# Patient Record
Sex: Female | Born: 1965 | Race: White | Hispanic: No | Marital: Married | Smoking: Current every day smoker
Health system: Southern US, Community
[De-identification: ages and names within clinical notes are randomized; demographics above are authoritative.]

## PROBLEM LIST (undated history)

## (undated) DIAGNOSIS — I10 Essential (primary) hypertension: Secondary | ICD-10-CM

## (undated) DIAGNOSIS — F32A Depression, unspecified: Secondary | ICD-10-CM

## (undated) DIAGNOSIS — M51369 Other intervertebral disc degeneration, lumbar region without mention of lumbar back pain or lower extremity pain: Secondary | ICD-10-CM

## (undated) DIAGNOSIS — F329 Major depressive disorder, single episode, unspecified: Secondary | ICD-10-CM

## (undated) DIAGNOSIS — M1990 Unspecified osteoarthritis, unspecified site: Secondary | ICD-10-CM

## (undated) HISTORY — DX: Depression, unspecified: F32.A

## (undated) HISTORY — DX: Major depressive disorder, single episode, unspecified: F32.9

---

## 2001-02-22 ENCOUNTER — Ambulatory Visit (HOSPITAL_COMMUNITY): Admission: RE | Admit: 2001-02-22 | Discharge: 2001-02-22 | Payer: Self-pay | Admitting: Pulmonary Disease

## 2002-06-18 ENCOUNTER — Emergency Department (HOSPITAL_COMMUNITY): Admission: EM | Admit: 2002-06-18 | Discharge: 2002-06-18 | Payer: Self-pay | Admitting: *Deleted

## 2004-09-18 ENCOUNTER — Ambulatory Visit (HOSPITAL_COMMUNITY): Admission: RE | Admit: 2004-09-18 | Discharge: 2004-09-18 | Payer: Self-pay | Admitting: Family Medicine

## 2004-12-05 ENCOUNTER — Ambulatory Visit (HOSPITAL_COMMUNITY): Admission: RE | Admit: 2004-12-05 | Discharge: 2004-12-05 | Payer: Self-pay | Admitting: Family Medicine

## 2005-04-07 ENCOUNTER — Ambulatory Visit (HOSPITAL_COMMUNITY): Admission: RE | Admit: 2005-04-07 | Discharge: 2005-04-07 | Payer: Self-pay | Admitting: Family Medicine

## 2005-04-14 ENCOUNTER — Ambulatory Visit (HOSPITAL_COMMUNITY): Admission: RE | Admit: 2005-04-14 | Discharge: 2005-04-14 | Payer: Self-pay | Admitting: Family Medicine

## 2005-11-01 ENCOUNTER — Emergency Department (HOSPITAL_COMMUNITY): Admission: EM | Admit: 2005-11-01 | Discharge: 2005-11-01 | Payer: Self-pay | Admitting: Emergency Medicine

## 2006-12-19 ENCOUNTER — Emergency Department: Payer: Self-pay | Admitting: Internal Medicine

## 2007-10-25 ENCOUNTER — Emergency Department (HOSPITAL_COMMUNITY): Admission: EM | Admit: 2007-10-25 | Discharge: 2007-10-26 | Payer: Self-pay | Admitting: Emergency Medicine

## 2007-10-29 ENCOUNTER — Emergency Department (HOSPITAL_COMMUNITY): Admission: EM | Admit: 2007-10-29 | Discharge: 2007-10-29 | Payer: Self-pay | Admitting: Emergency Medicine

## 2007-11-01 ENCOUNTER — Emergency Department (HOSPITAL_COMMUNITY): Admission: EM | Admit: 2007-11-01 | Discharge: 2007-11-01 | Payer: Self-pay | Admitting: Emergency Medicine

## 2008-07-21 ENCOUNTER — Emergency Department (HOSPITAL_COMMUNITY): Admission: EM | Admit: 2008-07-21 | Discharge: 2008-07-21 | Payer: Self-pay | Admitting: Emergency Medicine

## 2010-04-28 LAB — URINALYSIS, ROUTINE W REFLEX MICROSCOPIC
Bilirubin Urine: NEGATIVE
Glucose, UA: NEGATIVE mg/dL
Ketones, ur: NEGATIVE mg/dL
Leukocytes, UA: NEGATIVE
Nitrite: NEGATIVE
Protein, ur: NEGATIVE mg/dL
Specific Gravity, Urine: 1.015 (ref 1.005–1.030)
Urobilinogen, UA: 0.2 mg/dL (ref 0.0–1.0)
pH: 6.5 (ref 5.0–8.0)

## 2010-04-28 LAB — BASIC METABOLIC PANEL
BUN: 4 mg/dL — ABNORMAL LOW (ref 6–23)
CO2: 28 mEq/L (ref 19–32)
Calcium: 9.7 mg/dL (ref 8.4–10.5)
Chloride: 106 mEq/L (ref 96–112)
Creatinine, Ser: 0.65 mg/dL (ref 0.4–1.2)
GFR calc Af Amer: >60 mL/min (ref >60)
GFR calc non Af Amer: >60 mL/min (ref >60)
Glucose, Bld: 110 mg/dL — ABNORMAL HIGH (ref 70–99)
Potassium: 4.2 mEq/L (ref 3.5–5.1)
Sodium: 138 mEq/L (ref 135–145)

## 2010-04-28 LAB — CBC
HCT: 39.5 % (ref 36.0–46.0)
Hemoglobin: 13.9 g/dL (ref 12.0–15.0)
MCHC: 35.2 g/dL (ref 30.0–36.0)
MCV: 92.2 fL (ref 78.0–100.0)
Platelets: 214 10*3/uL (ref 150–400)
RBC: 4.29 MIL/uL (ref 3.87–5.11)
RDW: 13.8 % (ref 11.5–15.5)
WBC: 6.3 10*3/uL (ref 4.0–10.5)

## 2010-04-28 LAB — URINE MICROSCOPIC-ADD ON

## 2010-04-28 LAB — DIFFERENTIAL
Basophils Absolute: 0 10*3/uL (ref 0.0–0.1)
Basophils Relative: 0 % (ref 0–1)
Eosinophils Absolute: 0.1 10*3/uL (ref 0.0–0.7)
Eosinophils Relative: 1 % (ref 0–5)
Lymphocytes Relative: 17 % (ref 12–46)
Lymphs Abs: 1 10*3/uL (ref 0.7–4.0)
Monocytes Absolute: 0.3 10*3/uL (ref 0.1–1.0)
Monocytes Relative: 4 % (ref 3–12)
Neutro Abs: 4.9 10*3/uL (ref 1.7–7.7)
Neutrophils Relative %: 78 % — ABNORMAL HIGH (ref 43–77)

## 2010-04-28 LAB — PREGNANCY, URINE: Preg Test, Ur: NEGATIVE

## 2010-06-06 NOTE — Procedures (Signed)
[facility]  Patient:    LAN, ENTSMINGER Visit Number: [id number] MRN: [id number]          Service Type: Attending:  Kari Baars, M.D. Dictated by:   Kari Baars, M.D. Proc. Date: 02/22/01                      Pulmonary Function Test Inter.  IMPRESSION: 1. Spirometry shows a mild ventilatory defect without definite air flow    obstruction. 2. Lung volumes show restrictive change 3. DLCO is moderately reduced at about the same amount as the restrictive    change. 4. ABGs show mild resting hypoxemia compared to predicted, but are still    normal. 5. There is no significant bronchodilator change. Dictated by:   Kari Baars, M.D. Attending:  Kari Baars, M.D. DD:  02/22/01 TD:  02/23/01 Job: [id number]

## 2010-11-07 ENCOUNTER — Encounter: Payer: Self-pay | Admitting: *Deleted

## 2010-11-07 ENCOUNTER — Emergency Department (HOSPITAL_COMMUNITY)
Admission: EM | Admit: 2010-11-07 | Discharge: 2010-11-08 | Disposition: A | Discharge status: Home / Self Care | Payer: Self-pay | Attending: Emergency Medicine | Admitting: Emergency Medicine

## 2010-11-07 ENCOUNTER — Emergency Department (HOSPITAL_COMMUNITY): Payer: Self-pay

## 2010-11-07 DIAGNOSIS — R6883 Chills (without fever): Secondary | ICD-10-CM | POA: Insufficient documentation

## 2010-11-07 DIAGNOSIS — R059 Cough, unspecified: Secondary | ICD-10-CM | POA: Insufficient documentation

## 2010-11-07 DIAGNOSIS — R1032 Left lower quadrant pain: Secondary | ICD-10-CM | POA: Insufficient documentation

## 2010-11-07 DIAGNOSIS — F17200 Nicotine dependence, unspecified, uncomplicated: Secondary | ICD-10-CM | POA: Insufficient documentation

## 2010-11-07 DIAGNOSIS — D72829 Elevated white blood cell count, unspecified: Secondary | ICD-10-CM | POA: Insufficient documentation

## 2010-11-07 DIAGNOSIS — R112 Nausea with vomiting, unspecified: Secondary | ICD-10-CM | POA: Insufficient documentation

## 2010-11-07 DIAGNOSIS — R05 Cough: Secondary | ICD-10-CM

## 2010-11-07 LAB — BASIC METABOLIC PANEL
BUN: 8 mg/dL (ref 6–23)
CO2: 27 mEq/L (ref 19–32)
Chloride: 101 mEq/L (ref 96–112)
Creatinine, Ser: 0.63 mg/dL (ref 0.50–1.10)
GFR calc Af Amer: >90 mL/min (ref >90)
GFR calc non Af Amer: >90 mL/min (ref >90)
Glucose, Bld: 107 mg/dL — ABNORMAL HIGH (ref 70–99)
Potassium: 3.9 mEq/L (ref 3.5–5.1)

## 2010-11-07 LAB — DIFFERENTIAL
Basophils Absolute: 0 10*3/uL (ref 0.0–0.1)
Basophils Relative: 0 % (ref 0–1)
Eosinophils Absolute: 0 10*3/uL (ref 0.0–0.7)
Eosinophils Relative: 0 % (ref 0–5)
Lymphocytes Relative: 8 % — ABNORMAL LOW (ref 12–46)
Lymphs Abs: 1 10*3/uL (ref 0.7–4.0)
Monocytes Absolute: 0.7 10*3/uL (ref 0.1–1.0)
Monocytes Relative: 5 % (ref 3–12)
Neutro Abs: 11 10*3/uL — ABNORMAL HIGH (ref 1.7–7.7)
Neutrophils Relative %: 87 % — ABNORMAL HIGH (ref 43–77)

## 2010-11-07 LAB — CBC
HCT: 42.9 % (ref 36.0–46.0)
Hemoglobin: 14.6 g/dL (ref 12.0–15.0)
MCH: 31.3 pg (ref 26.0–34.0)
MCHC: 34 g/dL (ref 30.0–36.0)
MCV: 91.9 fL (ref 78.0–100.0)
Platelets: 270 10*3/uL (ref 150–400)
RBC: 4.67 MIL/uL (ref 3.87–5.11)
RDW: 13.3 % (ref 11.5–15.5)

## 2010-11-07 LAB — URINALYSIS, ROUTINE W REFLEX MICROSCOPIC
Glucose, UA: NEGATIVE mg/dL
Hgb urine dipstick: NEGATIVE
Ketones, ur: NEGATIVE mg/dL
Leukocytes, UA: NEGATIVE
Nitrite: NEGATIVE
Protein, ur: NEGATIVE mg/dL
Specific Gravity, Urine: >1.03 — ABNORMAL HIGH (ref 1.005–1.030)
Urobilinogen, UA: 0.2 mg/dL (ref 0.0–1.0)
pH: 5.5 (ref 5.0–8.0)

## 2010-11-07 MED ORDER — ALBUTEROL SULFATE (5 MG/ML) 0.5% IN NEBU
2.5000 mg | INHALATION_SOLUTION | Freq: Once | RESPIRATORY_TRACT | Status: AC
  Administered 2010-11-07: 2.5 mg via RESPIRATORY_TRACT
  Filled 2010-11-07: qty 0.5

## 2010-11-07 MED ORDER — ALBUTEROL SULFATE (5 MG/ML) 0.5% IN NEBU
INHALATION_SOLUTION | RESPIRATORY_TRACT | Status: AC
  Administered 2010-11-07: 2.5 mg
  Filled 2010-11-07: qty 0.5

## 2010-11-07 MED ORDER — ACETAMINOPHEN 500 MG PO TABS
1000.0000 mg | ORAL_TABLET | Freq: Once | ORAL | Status: AC
  Administered 2010-11-07: 1000 mg via ORAL
  Filled 2010-11-07: qty 2

## 2010-11-07 MED ORDER — SODIUM CHLORIDE 0.9 % IV BOLUS (SEPSIS)
1000.0000 mL | Freq: Once | INTRAVENOUS | Status: AC
  Administered 2010-11-07: 1000 mL via INTRAVENOUS

## 2010-11-07 MED ORDER — SODIUM CHLORIDE 0.9 % IV BOLUS (SEPSIS): 1000.0000 mL | Freq: Once | INTRAVENOUS | Status: DC

## 2010-11-07 MED ORDER — ONDANSETRON HCL 4 MG/2ML IJ SOLN
4.0000 mg | Freq: Once | INTRAMUSCULAR | Status: AC
  Administered 2010-11-07: 4 mg via INTRAVENOUS
  Filled 2010-11-07: qty 2

## 2010-11-07 NOTE — ED Notes (Signed)
Pt c/o n/v, chills, lower left sided abdominal pain.

## 2010-11-08 MED ORDER — GUAIFENESIN-CODEINE 100-10 MG/5ML PO SOLN
ORAL | Status: AC
  Filled 2010-11-08: qty 5

## 2010-11-08 MED ORDER — GUAIFENESIN-CODEINE 100-10 MG/5ML PO SYRP: 5.0000 mL | ORAL_SOLUTION | Freq: Three times a day (TID) | ORAL | Status: AC | PRN

## 2010-11-08 MED ORDER — GUAIFENESIN-CODEINE 100-10 MG/5ML PO SOLN
10.0000 mL | Freq: Once | ORAL | Status: AC
  Administered 2010-11-08: 10 mL via ORAL

## 2010-11-08 MED ORDER — PROMETHAZINE HCL 25 MG PO TABS: 12.5000 mg | ORAL_TABLET | Freq: Four times a day (QID) | ORAL | Status: AC | PRN

## 2010-11-08 NOTE — ED Provider Notes (Signed)
History     CSN: [id number] Arrival date & time: 11/07/2010  9:35 PM   First MD Initiated Contact with Patient 11/07/10 2313      Chief Complaint  Patient presents with  . Nausea  . Emesis  . Abdominal Pain  . Chills    (Consider location/radiation/quality/duration/timing/severity/associated sxs/prior treatment) HPI Comments: Seen 2313. Patient with cough, nausea, vomiting and abdominal pain. Abdominal pain is in LLQ and made worse by the cough. Denies diarrhea, blood in the stool. Cough is non productive. She has not taken anything to help the nausea, vomiting or cough.  Patient is a 45 y.o. female presenting with vomiting and abdominal pain. The history is provided by the patient.  Emesis  This is a new problem. The current episode started yesterday. The problem occurs 2 to 4 times per day. The problem has been gradually worsening. There has been no fever. Associated symptoms include abdominal pain, chills and cough.  Abdominal Pain The primary symptoms of the illness include abdominal pain, nausea and vomiting.  Additional symptoms associated with the illness include chills.    History reviewed. No pertinent past medical history.  History reviewed. No pertinent past surgical history.  History reviewed. No pertinent family history.  History  Substance Use Topics  . Smoking status: Current Everyday Smoker  . Smokeless tobacco: Not on file  . Alcohol Use: No    OB History    Grav Para Term Preterm Abortions TAB SAB Ect Mult Living                  Review of Systems  Constitutional: Positive for chills.  Respiratory: Positive for cough.   Gastrointestinal: Positive for nausea, vomiting and abdominal pain.  All other systems reviewed and are negative.    Allergies  Augmentin  Home Medications  No current outpatient prescriptions on file.  BP 125/83  Pulse 110  Temp(Src) 98.7 F (37.1 C) (Oral)  Resp 20  Ht 5\' 3"  (1.6 m)  Wt 154 lb (69.854 kg)  BMI  27.28 kg/m2  SpO2 98%  LMP 10/26/2010  Physical Exam  Nursing note and vitals reviewed. Constitutional: She is oriented to person, place, and time. She appears well-developed and well-nourished. No distress.  HENT:  Head: Normocephalic.  Eyes: EOM are normal.  Neck: Normal range of motion.  Cardiovascular: Normal rate, normal heart sounds and intact distal pulses.   Pulmonary/Chest: Breath sounds normal. She has no wheezes. She has no rales. She exhibits no tenderness.  Abdominal: Soft. She exhibits no distension. There is no tenderness. There is no rebound and no guarding.  Musculoskeletal: Normal range of motion.  Neurological: She is alert and oriented to person, place, and time.  Skin: Skin is warm and dry.    ED Course  Procedures (including critical care time)  Labs Reviewed  CBC - Abnormal; Notable for the following:    WBC 12.8 (*)    All other components within normal limits  DIFFERENTIAL - Abnormal; Notable for the following:    Neutrophils Relative 87 (*)    Neutro Abs 11.0 (*)    Lymphocytes Relative 8 (*)    All other components within normal limits  BASIC METABOLIC PANEL - Abnormal; Notable for the following:    Glucose, Bld 107 (*)    All other components within normal limits  URINALYSIS, ROUTINE W REFLEX MICROSCOPIC - Abnormal; Notable for the following:    Specific Gravity, Urine >1.030 (*)    Bilirubin Urine SMALL (*)  All other components within normal limits   Dg Chest 2 View  11/07/2010  *RADIOLOGY REPORT*  Clinical Data: Cough.  Congestion.  Tobacco use.  CHEST - 2 VIEW  Comparison: 04/07/2005  Findings: Faint prominence of the interstitium is present, and is often encountered in smokers.  No airspace opacity is observed.  Cardiac and mediastinal contours appear unremarkable.  No pleural effusion noted.  IMPRESSION:  1. Faint prominence of the interstitium is present, and is often encountered in smokers.    Otherwise, no significant abnormality  identified.  Original Report Authenticated By: Dellia Cloud, M.D.     No diagnosis found.    MDM  Patient with nausea, vomiting, cough and abdominal pain. Abdominal pain to LLQ worse with coughing. PE unremarkable. Unable to elicit pain with exam. Labs with slightly elevated wbc, no source found. Chest xray normal, UA c/w h/o vomiting.Patient given IVF, cough medicine, tylenol. Took PO fluids. Pt feels improved after observation and/or treatment in ED.Pt stable in ED with no significant deterioration in condition. MDM Reviewed: nursing note and vitals Interpretation: labs and x-ray           Nicoletta Dress. Colon Branch, MD 11/15/10 [phone]

## 2015-08-09 ENCOUNTER — Other Ambulatory Visit (HOSPITAL_COMMUNITY): Payer: Self-pay | Admitting: Family Medicine

## 2015-08-09 DIAGNOSIS — Z1231 Encounter for screening mammogram for malignant neoplasm of breast: Secondary | ICD-10-CM

## 2015-09-04 ENCOUNTER — Encounter (HOSPITAL_COMMUNITY): Payer: Self-pay | Admitting: Radiology

## 2015-09-04 ENCOUNTER — Ambulatory Visit (HOSPITAL_COMMUNITY)
Admission: RE | Admit: 2015-09-04 | Discharge: 2015-09-04 | Disposition: A | Discharge status: Home / Self Care | Payer: BLUE CROSS/BLUE SHIELD | Source: Ambulatory Visit | Attending: Family Medicine | Admitting: Family Medicine

## 2015-09-04 DIAGNOSIS — Z1231 Encounter for screening mammogram for malignant neoplasm of breast: Secondary | ICD-10-CM | POA: Diagnosis present

## 2017-04-05 ENCOUNTER — Other Ambulatory Visit (HOSPITAL_COMMUNITY): Payer: Self-pay | Admitting: Family Medicine

## 2017-04-05 DIAGNOSIS — E041 Nontoxic single thyroid nodule: Secondary | ICD-10-CM

## 2017-04-05 DIAGNOSIS — M25572 Pain in left ankle and joints of left foot: Secondary | ICD-10-CM

## 2017-04-05 DIAGNOSIS — M25571 Pain in right ankle and joints of right foot: Secondary | ICD-10-CM

## 2017-04-12 ENCOUNTER — Ambulatory Visit (HOSPITAL_COMMUNITY)
Admission: RE | Admit: 2017-04-12 | Discharge: 2017-04-12 | Disposition: A | Discharge status: Home / Self Care | Payer: BLUE CROSS/BLUE SHIELD | Source: Ambulatory Visit | Attending: Family Medicine | Admitting: Family Medicine

## 2017-04-12 ENCOUNTER — Encounter (HOSPITAL_COMMUNITY): Payer: Self-pay

## 2017-04-14 ENCOUNTER — Other Ambulatory Visit (HOSPITAL_COMMUNITY): Payer: Self-pay | Admitting: Family Medicine

## 2017-04-14 ENCOUNTER — Ambulatory Visit (HOSPITAL_COMMUNITY)
Admission: RE | Admit: 2017-04-14 | Discharge: 2017-04-14 | Disposition: A | Discharge status: Home / Self Care | Payer: BLUE CROSS/BLUE SHIELD | Source: Ambulatory Visit | Attending: Family Medicine | Admitting: Family Medicine

## 2017-04-14 DIAGNOSIS — M25571 Pain in right ankle and joints of right foot: Secondary | ICD-10-CM

## 2017-04-14 DIAGNOSIS — M25572 Pain in left ankle and joints of left foot: Secondary | ICD-10-CM | POA: Insufficient documentation

## 2017-04-14 DIAGNOSIS — R531 Weakness: Secondary | ICD-10-CM

## 2017-04-14 DIAGNOSIS — M7732 Calcaneal spur, left foot: Secondary | ICD-10-CM | POA: Insufficient documentation

## 2017-04-14 DIAGNOSIS — M7731 Calcaneal spur, right foot: Secondary | ICD-10-CM | POA: Diagnosis not present

## 2017-04-14 DIAGNOSIS — R52 Pain, unspecified: Secondary | ICD-10-CM

## 2017-04-14 DIAGNOSIS — E041 Nontoxic single thyroid nodule: Secondary | ICD-10-CM

## 2017-04-16 ENCOUNTER — Other Ambulatory Visit (HOSPITAL_COMMUNITY): Payer: Self-pay | Admitting: Family Medicine

## 2017-04-16 ENCOUNTER — Other Ambulatory Visit (HOSPITAL_COMMUNITY): Payer: Self-pay | Admitting: Pulmonary Disease

## 2017-04-16 DIAGNOSIS — E041 Nontoxic single thyroid nodule: Secondary | ICD-10-CM

## 2017-04-16 DIAGNOSIS — R05 Cough: Secondary | ICD-10-CM

## 2017-04-16 DIAGNOSIS — R059 Cough, unspecified: Secondary | ICD-10-CM

## 2017-04-21 ENCOUNTER — Encounter (HOSPITAL_COMMUNITY): Payer: Self-pay

## 2017-04-21 ENCOUNTER — Ambulatory Visit (HOSPITAL_COMMUNITY)
Admission: RE | Admit: 2017-04-21 | Discharge: 2017-04-21 | Disposition: A | Discharge status: Home / Self Care | Payer: BLUE CROSS/BLUE SHIELD | Source: Ambulatory Visit | Attending: Family Medicine | Admitting: Family Medicine

## 2017-04-21 DIAGNOSIS — E041 Nontoxic single thyroid nodule: Secondary | ICD-10-CM

## 2017-04-21 HISTORY — DX: Unspecified osteoarthritis, unspecified site: M19.90

## 2017-04-21 HISTORY — DX: Essential (primary) hypertension: I10

## 2017-04-21 MED ORDER — LIDOCAINE HCL (PF) 1 % IJ SOLN
INTRAMUSCULAR | Status: AC
  Filled 2017-04-21: qty 5

## 2017-04-22 ENCOUNTER — Ambulatory Visit (HOSPITAL_COMMUNITY): Payer: BLUE CROSS/BLUE SHIELD

## 2017-05-06 ENCOUNTER — Encounter (HOSPITAL_COMMUNITY): Payer: Self-pay | Admitting: Radiology

## 2017-06-21 ENCOUNTER — Encounter: Payer: Self-pay | Admitting: "Endocrinology

## 2017-06-21 ENCOUNTER — Ambulatory Visit (INDEPENDENT_AMBULATORY_CARE_PROVIDER_SITE_OTHER): Payer: BLUE CROSS/BLUE SHIELD | Admitting: "Endocrinology

## 2017-06-21 VITALS — BP 109/72 | HR 88 | Ht 63.0 in | Wt 167.0 lb

## 2017-06-21 DIAGNOSIS — E042 Nontoxic multinodular goiter: Secondary | ICD-10-CM | POA: Diagnosis not present

## 2017-06-21 NOTE — Progress Notes (Signed)
Endocrinology Consult Note                                            06/21/2017, 4:32 PM   Subjective:    Patient ID: Sarah Henderson, female    DOB: 05-18-1965, PCP Gareth Morgan, MD   Past Medical History:  Diagnosis Date  . Arthritis   . Depression   . Hypertension    History reviewed. No pertinent surgical history. Social History   Socioeconomic History  . Marital status: Married    Spouse name: Not on file  . Number of children: Not on file  . Years of education: Not on file  . Highest education level: Not on file  Occupational History  . Not on file  Social Needs  . Financial resource strain: Not on file  . Food insecurity:    Worry: Not on file    Inability: Not on file  . Transportation needs:    Medical: Not on file    Non-medical: Not on file  Tobacco Use  . Smoking status: Current Every Day Smoker  . Smokeless tobacco: Never Used  Substance and Sexual Activity  . Alcohol use: No  . Drug use: No  . Sexual activity: Not on file  Lifestyle  . Physical activity:    Days per week: Not on file    Minutes per session: Not on file  . Stress: Not on file  Relationships  . Social connections:    Talks on phone: Not on file    Gets together: Not on file    Attends religious service: Not on file    Active member of club or organization: Not on file    Attends meetings of clubs or organizations: Not on file    Relationship status: Not on file  Other Topics Concern  . Not on file  Social History Narrative  . Not on file   Outpatient Encounter Medications as of 06/21/2017  Medication Sig  . FLUoxetine (PROZAC) 10 MG tablet daily.  . hydrochlorothiazide (HYDRODIURIL) 25 MG tablet daily.  . naproxen (NAPROSYN) 500 MG tablet 2 (two) times daily.   No facility-administered encounter medications on file as of 06/21/2017.    ALLERGIES: Allergies  Allergen Reactions  . Amoxicillin-Pot Clavulanate     VACCINATION STATUS:  There is no immunization  history on file for this patient.  HPI Sarah Henderson is 52 y.o. female who presents today with a medical history as above. she is being seen in consultation for multinodular goiter requested by Gareth Morgan, MD.  -Her medical history starts in March 2019 when she was found to have visible thyroid nodule on physical exam.  She was sent for thyroid ultrasound which was performed on April 14, 2017 which revealed right lobe measuring 4.9 cm, left lobe measuring 5.2 cm, 1.7 cm suspicious nodule in the isthmus and 1.4 cm nodule in the upper pole of the left lobe. -She was sent for fine-needle aspiration which was performed on April 21, 2017 which showed a CT of undetermined significance from the 1.7 cm isthmus nodule.  That sample was sent for molecular testing which showed benign findings.  -Patient denies any family history of thyroid cancer, however multiple family members have various forms of malignancies including pancreatic cancer, breast cancer.  She is a chronic heavy smoker.  She did not  require any intervention for thyroid dysfunction.  She does not have recent thyroid function test.  -She denies exposure to neck radiation.  He denies dysphagia, odynophagia, voice change.  She denies heat/cold intolerance.  She denies tremors, palpitations, nor recent weight changes.  Review of Systems  Constitutional: no weight gain/loss, no fatigue, no subjective hyperthermia, no subjective hypothermia Eyes: no blurry vision, no xerophthalmia ENT: no sore throat, no nodules palpated in throat, no dysphagia/odynophagia, no hoarseness Cardiovascular: no Chest Pain, no Shortness of Breath, no palpitations, no leg swelling Respiratory: + cough, no SOB Gastrointestinal: no Nausea/Vomiting/Diarhhea Musculoskeletal: no muscle/joint aches Skin: no rashes Neurological: no tremors, no numbness, no tingling, no dizziness Psychiatric: no depression, no anxiety  Objective:    BP 109/72   Pulse 88   Ht 5\' 3"   (1.6 m)   Wt 167 lb (75.8 kg)   BMI 29.58 kg/m   Wt Readings from Last 3 Encounters:  06/21/17 167 lb (75.8 kg)  11/07/10 154 lb (69.9 kg)    Physical Exam  Constitutional: + overweight for height, not in acute distress, normal state of mind Eyes: PERRLA, EOMI, no exophthalmos ENT: moist mucous membranes, + palpable nodular thyromegaly,  no cervical lymphadenopathy Cardiovascular: normal precordial activity, Regular Rate and Rhythm, no Murmur/Rubs/Gallops Respiratory:  adequate breathing efforts, no gross chest deformity, Clear to auscultation bilaterally Gastrointestinal: abdomen soft, Non -tender, No distension, Bowel Sounds present Musculoskeletal: no gross deformities, strength intact in all four extremities Skin: moist, warm, no rashes Neurological: no tremor with outstretched hands, Deep tendon reflexes normal in all four extremities.  CMP ( most recent) CMP     Component Value Date/Time   NA 138 11/07/2010 2156   K 3.9 11/07/2010 2156   CL 101 11/07/2010 2156   CO2 27 11/07/2010 2156   GLUCOSE 107 (H) 11/07/2010 2156   BUN 8 11/07/2010 2156   CREATININE 0.63 11/07/2010 2156   CALCIUM 9.7 11/07/2010 2156   GFRNONAA >90 11/07/2010 2156   GFRAA >90 11/07/2010 2156   Thyroid ultrasound April 14, 2017 right lobe measured 4.9 cm, left lobe measure 5.2 cm.  There was 1.7 cm nodule in the isthmus with suspicious features, another nodule 1.4 cm of the right pole of the left lobe.  Fine-needle aspiration on April 21, 2017 from the 1.7 cm isthmus nodule revealed atypia of undetermined significance. -Molecular testing on the sample was reported to be benign.     Assessment & Plan:   1. Multinodular goiter - Sarah Henderson  is being seen at a kind request of Gareth Morgan, MD. - I have reviewed her available thyroid records and clinically evaluated the patient. - Based on reviews, she has euthyroid multinodular goiter with complete work-up revealing benign findings. -She  will not require intervention at this time.  However, she will need repeat thyroid ultrasound in 15-month given the unbiopsied left upper pole nodule measuring 1.4 cm, and patient's history of chronic heavy smoking.  -She will be sent for complete set of thyroid function test today, and she will return in 1 week to discuss her labs. -She is extensively counseled for smoking cessation. - I did not initiate any new prescriptions today. - I advised her  to maintain close follow up with Gareth Morgan, MD for primary care needs.   - Time spent with the patient: 45 minutes, of which >50% was spent in obtaining information about her symptoms, reviewing her previous imaging studies, biopsy results, molecular studies, evaluations and counseling her about her normal  thyroid biopsy, and developing a for long-term follow-up.    Sarah Henderson participated in the discussions, expressed understanding, and voiced agreement with the above plans.  All questions were answered to her satisfaction. she is encouraged to contact clinic should she have any questions or concerns prior to her return visit.  Follow up plan: Return in about 1 week (around 06/28/2017) for labs today.   Marquis LunchGebre Sarah Diffee, MD [facility] [address] [place], [place] [address] Phone: [phone]  Fax: [phone]     06/21/2017, 4:32 PM  This note was partially dictated with voice recognition software. Similar sounding words can be transcribed inadequately or may not  be corrected upon review.

## 2017-06-22 LAB — TSH: TSH: 0.59 m[IU]/L

## 2017-06-22 LAB — T3, FREE: T3, Free: 3.2 pg/mL (ref 2.3–4.2)

## 2017-06-22 LAB — T4, FREE: FREE T4: 1.1 ng/dL (ref 0.8–1.8)

## 2017-06-22 LAB — THYROGLOBULIN ANTIBODY: Thyroglobulin Ab: <1 IU/mL (ref <=1)

## 2017-06-22 LAB — THYROID PEROXIDASE ANTIBODY: Thyroperoxidase Ab SerPl-aCnc: 1 IU/mL (ref <9)

## 2017-06-29 ENCOUNTER — Encounter: Payer: Self-pay | Admitting: "Endocrinology

## 2017-06-29 ENCOUNTER — Ambulatory Visit (INDEPENDENT_AMBULATORY_CARE_PROVIDER_SITE_OTHER): Payer: BLUE CROSS/BLUE SHIELD | Admitting: "Endocrinology

## 2017-06-29 VITALS — BP 120/79 | HR 75 | Ht 63.0 in | Wt 165.0 lb

## 2017-06-29 DIAGNOSIS — E042 Nontoxic multinodular goiter: Secondary | ICD-10-CM | POA: Diagnosis not present

## 2017-06-29 NOTE — Progress Notes (Signed)
Endocrinology follow-up note                                            06/29/2017, 2:47 PM   Subjective:    Patient ID: Sarah Henderson, female    DOB: November 15, 1965, PCP Gareth MorganKnowlton, Steve, MD   Past Medical History:  Diagnosis Date  . Arthritis   . Depression   . Hypertension    History reviewed. No pertinent surgical history. Social History   Socioeconomic History  . Marital status: Married    Spouse name: Not on file  . Number of children: Not on file  . Years of education: Not on file  . Highest education level: Not on file  Occupational History  . Not on file  Social Needs  . Financial resource strain: Not on file  . Food insecurity:    Worry: Not on file    Inability: Not on file  . Transportation needs:    Medical: Not on file    Non-medical: Not on file  Tobacco Use  . Smoking status: Current Every Day Smoker  . Smokeless tobacco: Never Used  Substance and Sexual Activity  . Alcohol use: No  . Drug use: No  . Sexual activity: Not on file  Lifestyle  . Physical activity:    Days per week: Not on file    Minutes per session: Not on file  . Stress: Not on file  Relationships  . Social connections:    Talks on phone: Not on file    Gets together: Not on file    Attends religious service: Not on file    Active member of club or organization: Not on file    Attends meetings of clubs or organizations: Not on file    Relationship status: Not on file  Other Topics Concern  . Not on file  Social History Narrative  . Not on file   Outpatient Encounter Medications as of 06/29/2017  Medication Sig  . FLUoxetine (PROZAC) 10 MG tablet daily.  . hydrochlorothiazide (HYDRODIURIL) 25 MG tablet daily.  . naproxen (NAPROSYN) 500 MG tablet 2 (two) times daily.   No facility-administered encounter medications on file as of 06/29/2017.    ALLERGIES: Allergies  Allergen Reactions  . Amoxicillin-Pot Clavulanate     VACCINATION STATUS:  There is no  immunization history on file for this patient.  HPI Sarah Henderson is 52 y.o. female who presents today with a medical history as above. she is being seen in follow-up with repeat thyroid function test after she was seen in consultation for multinodular goiter requested by Gareth MorganKnowlton, Steve, MD.  -Her medical history starts in March 2019 when she was found to have visible thyroid nodule on physical exam.  She was sent for thyroid ultrasound which was performed on April 14, 2017 which revealed right lobe measuring 4.9 cm, left lobe measuring 5.2 cm, 1.7 cm suspicious nodule in the isthmus and 1.4 cm nodule in the upper pole of the left lobe. -She was sent for fine-needle aspiration which was performed on April 21, 2017 which showed a CT of undetermined significance from the 1.7 cm isthmus nodule.  That sample was sent for molecular testing which showed benign findings. -After her last visit, she stopped by her lab for thyroid function tests which are within normal limits. -She has no new complaints  today.  -Patient denies any family history of thyroid cancer, however multiple family members have various forms of malignancies including pancreatic cancer, breast cancer.  She is a chronic heavy smoker.  She did not require any intervention for thyroid dysfunction.   -She denies exposure to neck radiation.  He denies dysphagia, odynophagia, voice change.  She denies heat/cold intolerance.  She denies tremors, palpitations, nor recent weight changes.  Review of Systems  Constitutional: + Stable weight,  no fatigue, no subjective hyperthermia, no subjective hypothermia Eyes: no blurry vision, no xerophthalmia ENT: no sore throat, no nodules palpated in throat, no dysphagia/odynophagia, no hoarseness Cardiovascular: no Chest Pain, no Shortness of Breath, no palpitations, no leg swelling Respiratory: + cough, no SOB Gastrointestinal: no Nausea/Vomiting/Diarhhea Musculoskeletal: no muscle/joint aches Skin:  no rashes Neurological: no tremors, no numbness, no tingling, no dizziness Psychiatric: no depression, no anxiety  Objective:    BP 120/79   Pulse 75   Wt 165 lb (74.8 kg)   BMI 29.23 kg/m   Wt Readings from Last 3 Encounters:  06/29/17 165 lb (74.8 kg)  06/21/17 167 lb (75.8 kg)  11/07/10 154 lb (69.9 kg)    Physical Exam  Constitutional: + overweight for height, not in acute distress, normal state of mind Eyes: PERRLA, EOMI, no exophthalmos ENT: moist mucous membranes, + palpable nodular thyromegaly,  no cervical lymphadenopathy  Musculoskeletal: no gross deformities, strength intact in all four extremities Skin: moist, warm, no rashes Neurological: no tremor with outstretched hands, Deep tendon reflexes normal in all four extremities.  CMP ( most recent) CMP     Component Value Date/Time   NA 138 11/07/2010 2156   K 3.9 11/07/2010 2156   CL 101 11/07/2010 2156   CO2 27 11/07/2010 2156   GLUCOSE 107 (H) 11/07/2010 2156   BUN 8 11/07/2010 2156   CREATININE 0.63 11/07/2010 2156   CALCIUM 9.7 11/07/2010 2156   GFRNONAA >90 11/07/2010 2156   GFRAA >90 11/07/2010 2156   Thyroid ultrasound April 14, 2017 right lobe measured 4.9 cm, left lobe measure 5.2 cm.  There was 1.7 cm nodule in the isthmus with suspicious features, another nodule 1.4 cm of the right pole of the left lobe.  Fine-needle aspiration on April 21, 2017 from the 1.7 cm isthmus nodule revealed atypia of undetermined significance. -Molecular testing on the sample was reported to be benign.  Recent Results (from the past 2160 hour(s))  Thyroglobulin antibody     Status: None   Collection Time: 06/21/17  3:30 PM  Result Value Ref Range   Thyroglobulin Ab <1 < or = 1 IU/mL  Thyroid peroxidase antibody     Status: None   Collection Time: 06/21/17  3:30 PM  Result Value Ref Range   Thyroperoxidase Ab SerPl-aCnc 1 <9 IU/mL  T3, free     Status: None   Collection Time: 06/21/17  3:30 PM  Result Value Ref  Range   T3, Free 3.2 2.3 - 4.2 pg/mL  T4, free     Status: None   Collection Time: 06/21/17  3:30 PM  Result Value Ref Range   Free T4 1.1 0.8 - 1.8 ng/dL  TSH     Status: None   Collection Time: 06/21/17  3:30 PM  Result Value Ref Range   TSH 0.59 mIU/L    Comment:           Reference Range .           > or = 20 Years  0.40-4.50 .                Pregnancy Ranges           First trimester    0.26-2.66           Second trimester   0.55-2.73           Third trimester    0.43-2.91       Assessment & Plan:   1. Multinodular goiter - Sarah Henderson  is being seen at a kind request of Gareth Morgan, MD. - I have reviewed her available thyroid records and clinically evaluated the patient. - Based on reviews, she has euthyroid multinodular goiter with complete work-up revealing benign findings. -Her recent previsit thyroid function tests are within normal limits. -She will not require intervention at this time.  However, she will need repeat thyroid ultrasound in 39-month given the unbiopsied left upper pole nodule measuring 1.4 cm, and patient's history of chronic heavy smoking.  -She is extensively counseled for smoking cessation. - I did not initiate any new prescriptions today. - I advised her  to maintain close follow up with Gareth Morgan, MD for primary care needs.   Follow up plan: Return in about 6 months (around 12/29/2017) for Thyroid / Neck Ultrasound.   Marquis Lunch, MD [employer] [facility] [address] [place], [place] [address] Phone: [phone]  Fax: [phone]     06/29/2017, 2:47 PM  This note was partially dictated with voice recognition software. Similar sounding words can be transcribed inadequately or may not  be corrected upon review.

## 2017-11-01 ENCOUNTER — Ambulatory Visit: Payer: BLUE CROSS/BLUE SHIELD | Admitting: "Endocrinology

## 2018-01-03 ENCOUNTER — Ambulatory Visit: Payer: BLUE CROSS/BLUE SHIELD | Admitting: "Endocrinology

## 2018-08-09 ENCOUNTER — Other Ambulatory Visit: Payer: Self-pay

## 2018-08-09 DIAGNOSIS — Z20822 Contact with and (suspected) exposure to covid-19: Secondary | ICD-10-CM

## 2018-08-11 LAB — NOVEL CORONAVIRUS, NAA: SARS-CoV-2, NAA: NOT DETECTED

## 2018-11-22 ENCOUNTER — Other Ambulatory Visit: Payer: Self-pay | Admitting: *Deleted

## 2018-11-22 DIAGNOSIS — Z20822 Contact with and (suspected) exposure to covid-19: Secondary | ICD-10-CM

## 2018-11-23 LAB — NOVEL CORONAVIRUS, NAA: SARS-CoV-2, NAA: NOT DETECTED

## 2018-11-27 IMAGING — US US THYROID
1 series · 13 of 25 positions shown · non-contrast
Comparison: None.

CLINICAL DATA: Palpable thyroid nodule

EXAM:
THYROID ULTRASOUND
TECHNIQUE: Ultrasound examination of the thyroid gland and adjacent soft
tissues was performed.

[Series 1: us thyroid · 0.06mm/px · 13 of 76 slices shown]
[im 1/76]
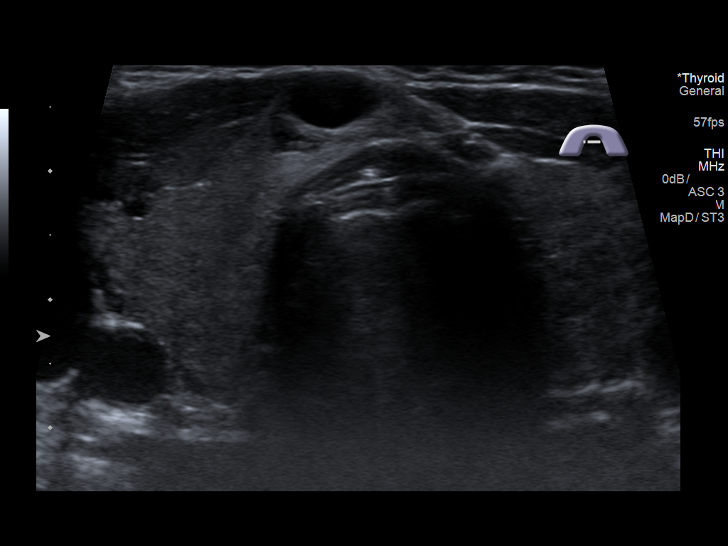
[im 7/76]
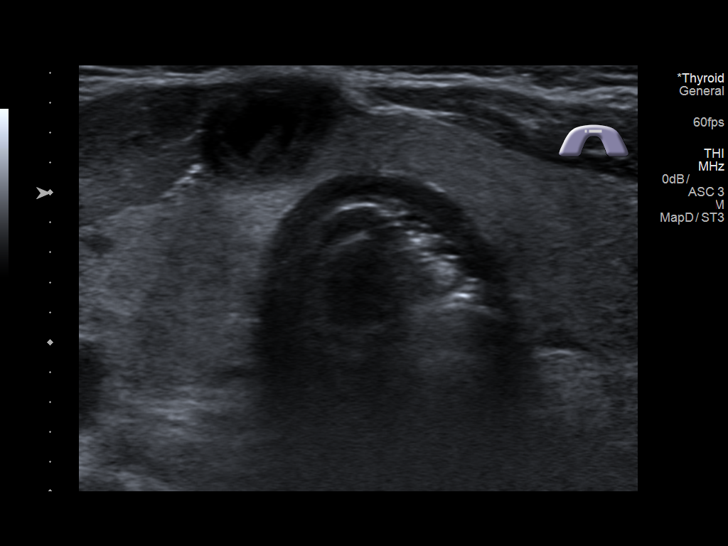
[im 13/76]
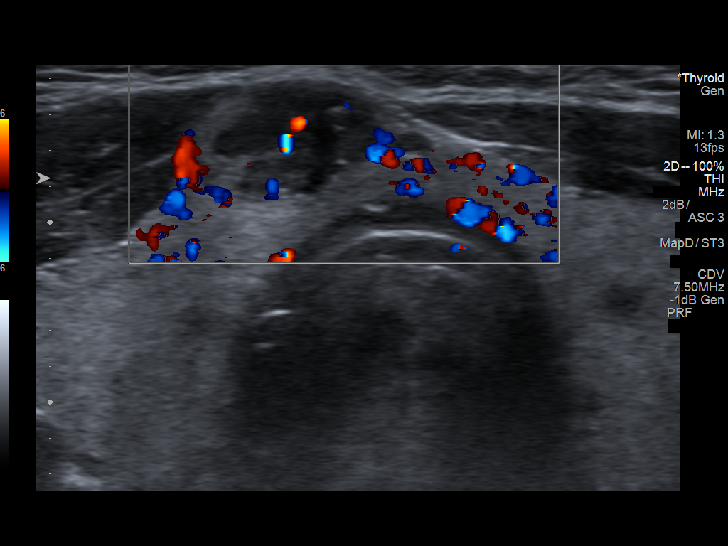
[im 19/76]
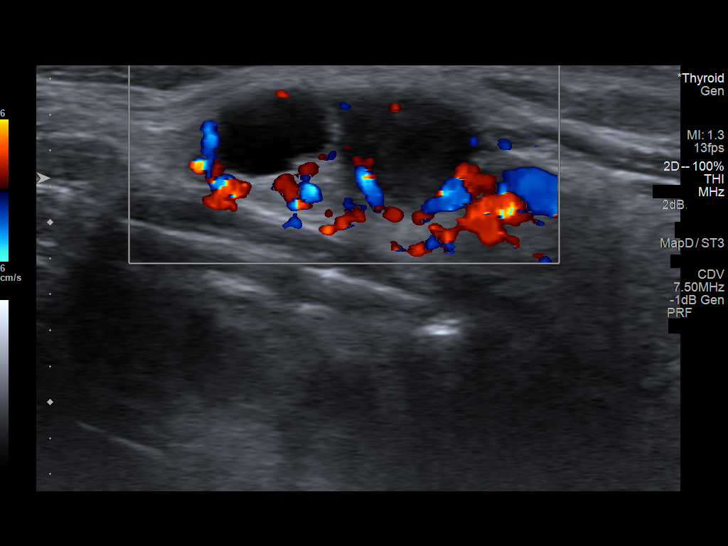
[im 26/76]
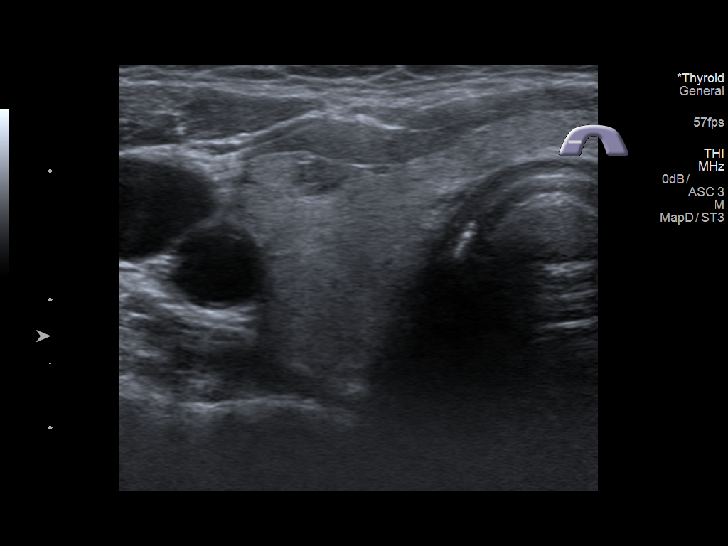
[im 32/76]
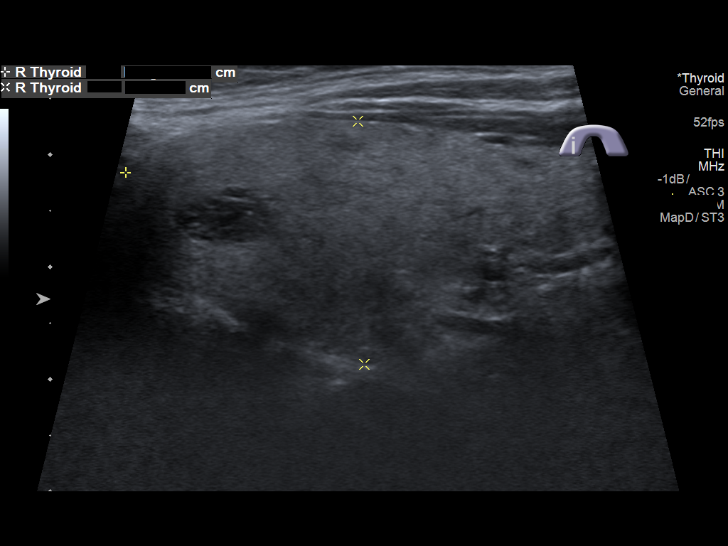
[im 38/76]
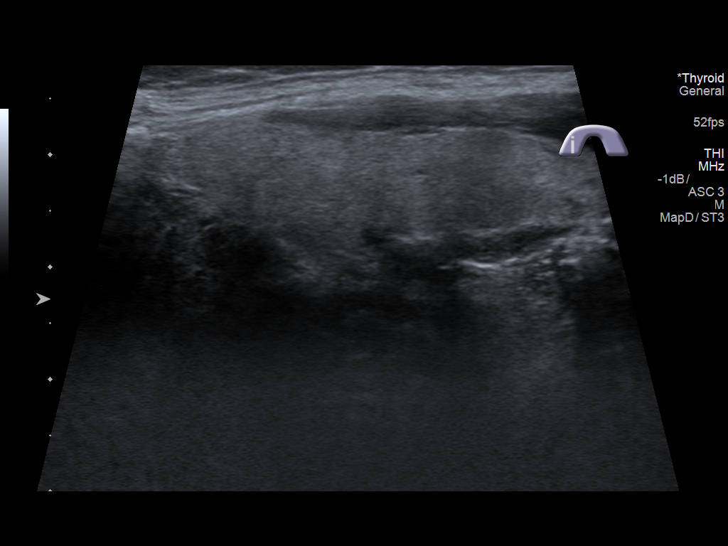
[im 44/76]
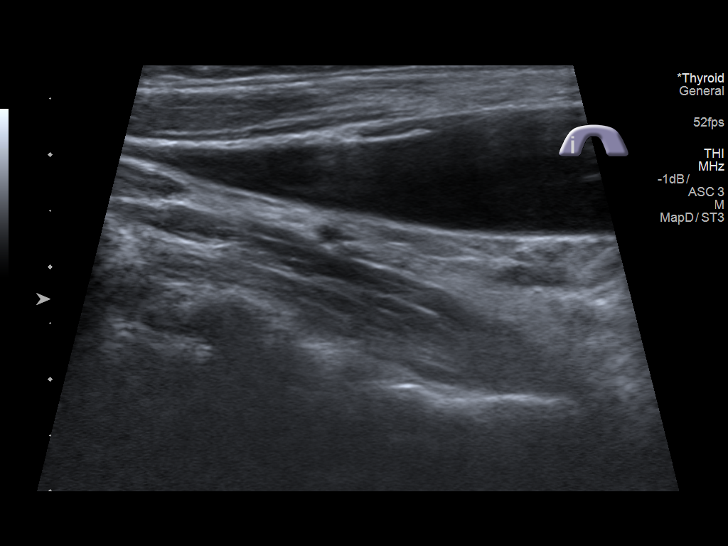
[im 51/76]
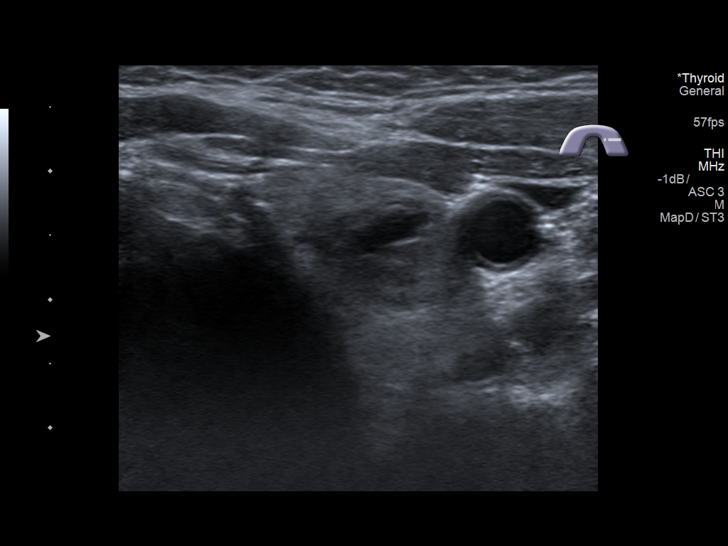
[im 57/76]
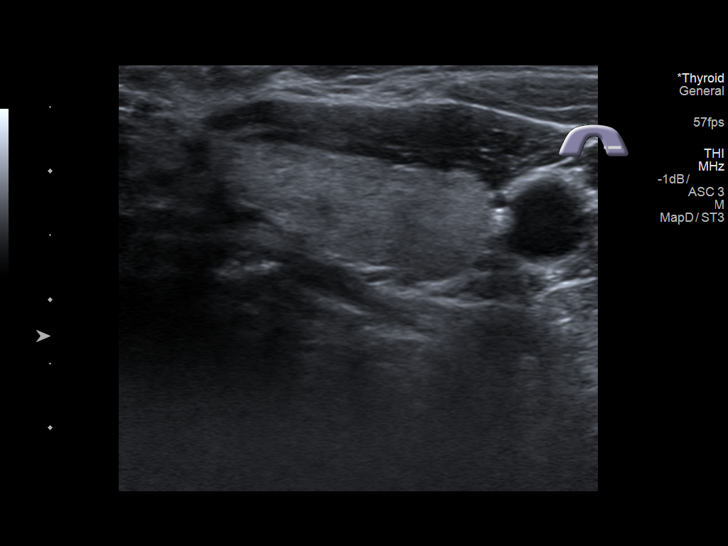
[im 63/76]
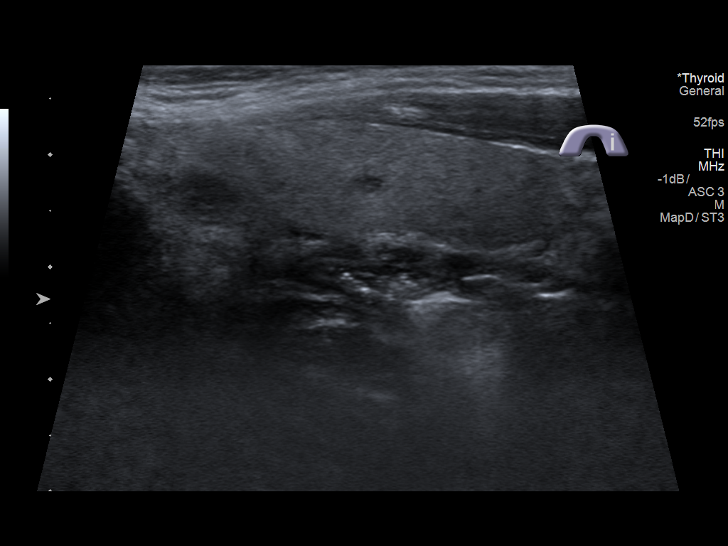
[im 69/76]
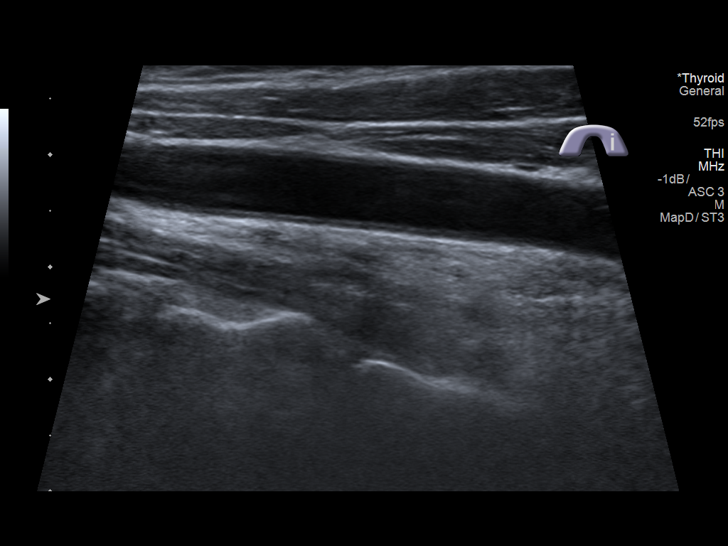
[im 76/76]
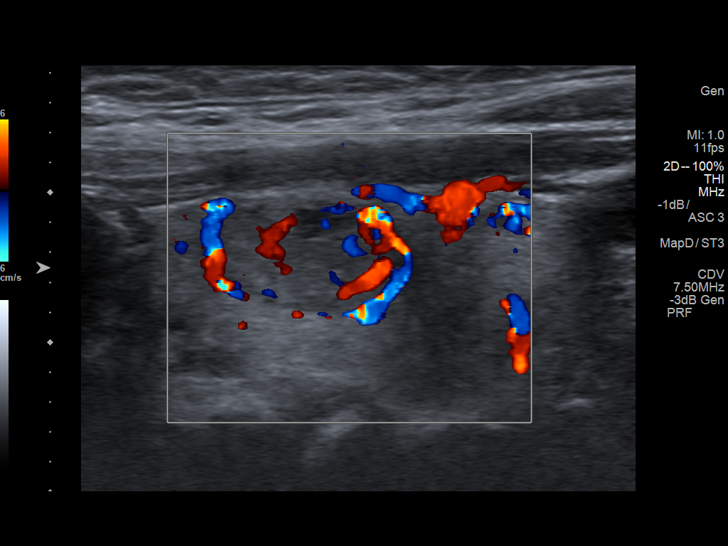

[13 of 25 positions shown; findings below may reference images not displayed]

FINDINGS: Parenchymal Echotexture: Mildly heterogenous

Isthmus: 4 mm

Right lobe: 4.9 x 2.2 x 1.7 cm

Left lobe:   5.2 x 1.6 x 1.5 cm

_________________________________________________________

Estimated total number of nodules >/= 1 cm: 2

Number of spongiform nodules >/=  2 cm not described below (TR1): 0

Number of mixed cystic and solid nodules >/= 1.5 cm not described
below (TR2): 0

_________________________________________________________

Nodule # 1:

Location: Isthmus; Mid

Maximum size: 1.7 cm; Other 2 dimensions: 1.2 x 0.7 cm

Composition: solid/almost completely solid (2)

Echogenicity: hypoechoic (2)

Shape: not taller-than-wide (0)

Margins: ill-defined (0)

Echogenic foci: none (0)

ACR TI-RADS total points: 4.

ACR TI-RADS risk category: TR4 (4-6 points).

ACR TI-RADS recommendations:

**Given size (>/= 1.5 cm) and appearance, fine needle aspiration of
this moderately suspicious nodule should be considered based on
TI-RADS criteria.

_________________________________________________________

Nodule # 3:

Location: Left; Superior

Maximum size: 1.4 cm; Other 2 dimensions: 1.1 x 0.8 cm

Composition: solid/almost completely solid (2)

Echogenicity: isoechoic (1)

Shape: not taller-than-wide (0)

Margins: ill-defined (0)

Echogenic foci: none (0)

ACR TI-RADS total points: 3.

ACR TI-RADS risk category: TR3 (3 points).

ACR TI-RADS recommendations:

Given size (<1.4 cm) and appearance, this nodule does NOT meet
TI-RADS criteria for biopsy or dedicated follow-up.

_________________________________________________________

There is an additional 9 mm spongiform nodule in the right thyroid
upper pole which would not meet criteria for any biopsy or
follow-up.

No adenopathy.
IMPRESSION: 1.7 cm isthmus TR 4 nodule meets criteria for biopsy as above.

The above is in keeping with the ACR TI-RADS recommendations - [HOSPITAL] 0684;[DATE].

## 2019-02-06 ENCOUNTER — Other Ambulatory Visit: Payer: Self-pay

## 2019-02-06 ENCOUNTER — Ambulatory Visit: Payer: Self-pay | Attending: Internal Medicine

## 2019-02-06 DIAGNOSIS — Z20822 Contact with and (suspected) exposure to covid-19: Secondary | ICD-10-CM | POA: Insufficient documentation

## 2019-02-07 LAB — NOVEL CORONAVIRUS, NAA: SARS-CoV-2, NAA: NOT DETECTED

## 2019-02-27 ENCOUNTER — Telehealth: Payer: Self-pay

## 2019-03-14 ENCOUNTER — Ambulatory Visit: Payer: Self-pay | Admitting: Physician Assistant

## 2019-03-16 ENCOUNTER — Ambulatory Visit: Payer: Self-pay | Admitting: Physician Assistant

## 2019-03-29 ENCOUNTER — Ambulatory Visit: Payer: Self-pay | Admitting: Physician Assistant

## 2019-03-29 ENCOUNTER — Other Ambulatory Visit: Payer: Self-pay

## 2019-03-29 ENCOUNTER — Encounter: Payer: Self-pay | Admitting: Physician Assistant

## 2019-03-29 VITALS — BP 118/72 | HR 87 | Temp 98.4°F | Ht 62.5 in | Wt 161.0 lb

## 2019-03-29 DIAGNOSIS — M79671 Pain in right foot: Secondary | ICD-10-CM

## 2019-03-29 DIAGNOSIS — Z1211 Encounter for screening for malignant neoplasm of colon: Secondary | ICD-10-CM

## 2019-03-29 DIAGNOSIS — Z131 Encounter for screening for diabetes mellitus: Secondary | ICD-10-CM

## 2019-03-29 DIAGNOSIS — Z7689 Persons encountering health services in other specified circumstances: Secondary | ICD-10-CM

## 2019-03-29 DIAGNOSIS — F17200 Nicotine dependence, unspecified, uncomplicated: Secondary | ICD-10-CM

## 2019-03-29 DIAGNOSIS — Z8639 Personal history of other endocrine, nutritional and metabolic disease: Secondary | ICD-10-CM

## 2019-03-29 DIAGNOSIS — I10 Essential (primary) hypertension: Secondary | ICD-10-CM

## 2019-03-29 DIAGNOSIS — Z13220 Encounter for screening for lipoid disorders: Secondary | ICD-10-CM

## 2019-03-29 DIAGNOSIS — Z1239 Encounter for other screening for malignant neoplasm of breast: Secondary | ICD-10-CM

## 2019-03-29 DIAGNOSIS — E042 Nontoxic multinodular goiter: Secondary | ICD-10-CM

## 2019-03-29 NOTE — Progress Notes (Signed)
BP 118/72   Pulse 87   Temp 98.4 F (36.9 C)   Ht 5' 2.5" (1.588 m)   Wt 161 lb (73 kg)   SpO2 99%   BMI 28.98 kg/m    Subjective:    Patient ID: Sarah Henderson, female    DOB: Jun 24, 1965, 54 y.o.   MRN: [id number]  HPI: Sarah Henderson is a 54 y.o. female presenting on 03/29/2019 for No chief complaint on file.   HPI  Pt had a negative covid 19 screening questionnaire.    Pt is a 6yoF who presents to office today to establish care.  Pt previously seen by dr Karie Kirks but she lost her insurance.    She works at [employer].    Last labs 2-3 yr ago Last pap 20 yr Last mammogram 2017  Pt complains of a Knot on of side of R foot for 3-4 months. she Says it hurts.  No injury but she sits with the foot bent under her.    Relevant past medical, surgical, family and social history reviewed and updated as indicated. Interim medical history since our last visit reviewed. Allergies and medications reviewed and updated.    Current Outpatient Medications:  .  hydrochlorothiazide (HYDRODIURIL) 25 MG tablet, daily., Disp: , Rfl: 0   Review of Systems  Per HPI unless specifically indicated above     Objective:    BP 118/72   Pulse 87   Temp 98.4 F (36.9 C)   Ht 5' 2.5" (1.588 m)   Wt 161 lb (73 kg)   SpO2 99%   BMI 28.98 kg/m   Wt Readings from Last 3 Encounters:  03/29/19 161 lb (73 kg)  06/29/17 165 lb (74.8 kg)  06/21/17 167 lb (75.8 kg)    Physical Exam Vitals reviewed.  Constitutional:      General: She is not in acute distress.    Appearance: Normal appearance. She is well-developed. She is not ill-appearing.  HENT:     Head: Normocephalic and atraumatic.  Eyes:     Conjunctiva/sclera: Conjunctivae normal.     Pupils: Pupils are equal, round, and reactive to light.  Neck:     Thyroid: No thyromegaly.  Cardiovascular:     Rate and Rhythm: Normal rate and regular rhythm.  Pulmonary:     Effort: Pulmonary effort is normal.   Breath sounds: Normal breath sounds.  Abdominal:     General: Bowel sounds are normal.     Palpations: Abdomen is soft. There is no mass.     Tenderness: There is no abdominal tenderness.  Musculoskeletal:     Cervical back: Neck supple.     Right lower leg: No edema.     Left lower leg: No edema.  Lymphadenopathy:     Cervical: No cervical adenopathy.  Skin:    General: Skin is warm and dry.  Neurological:     Mental Status: She is alert and oriented to person, place, and time.     Gait: Gait normal.  Psychiatric:        Attention and Perception: Attention normal.        Mood and Affect: Mood normal.        Speech: Speech normal.        Behavior: Behavior normal. Behavior is cooperative.         Assessment & Plan:   Encounter Diagnoses  Name Primary?  . Encounter to establish care Yes  . Right foot pain   .  Essential hypertension   . Multinodular goiter   . Tobacco use disorder   . Screening cholesterol level   . Screening for diabetes mellitus   . History of elevated glucose   . Encounter for screening for malignant neoplasm of breast, unspecified screening modality   . Screening for colon cancer       -will update Fasting labs -will refer for screening Mammogram  -will ypdate pap in the near future -ifobt was given for colon cancer screening -will obtainXray R foot.  Pt may have stress fracture due to repeatedly sitting in awkward position and her history of smoking -pt is given application for financial assistance/CA[employer] -pt to F/u 1 month to review labs, foot xray and update pap.  She is to contact office sooner prn

## 2019-04-01 ENCOUNTER — Other Ambulatory Visit: Payer: Self-pay | Admitting: Physician Assistant

## 2019-04-01 DIAGNOSIS — Z1211 Encounter for screening for malignant neoplasm of colon: Secondary | ICD-10-CM

## 2019-04-05 ENCOUNTER — Other Ambulatory Visit (HOSPITAL_COMMUNITY)
Admission: RE | Admit: 2019-04-05 | Discharge: 2019-04-05 | Disposition: A | Discharge status: Home / Self Care | Payer: Self-pay | Source: Ambulatory Visit | Attending: Physician Assistant | Admitting: Physician Assistant

## 2019-04-05 DIAGNOSIS — Z131 Encounter for screening for diabetes mellitus: Secondary | ICD-10-CM | POA: Insufficient documentation

## 2019-04-05 DIAGNOSIS — E042 Nontoxic multinodular goiter: Secondary | ICD-10-CM | POA: Insufficient documentation

## 2019-04-05 DIAGNOSIS — I10 Essential (primary) hypertension: Secondary | ICD-10-CM | POA: Insufficient documentation

## 2019-04-05 DIAGNOSIS — Z13220 Encounter for screening for lipoid disorders: Secondary | ICD-10-CM | POA: Insufficient documentation

## 2019-04-05 DIAGNOSIS — Z8639 Personal history of other endocrine, nutritional and metabolic disease: Secondary | ICD-10-CM | POA: Insufficient documentation

## 2019-04-05 LAB — COMPREHENSIVE METABOLIC PANEL
ALT: 22 U/L (ref 0–44)
AST: 16 U/L (ref 15–41)
Albumin: 4.1 g/dL (ref 3.5–5.0)
Alkaline Phosphatase: 55 U/L (ref 38–126)
Anion gap: 8 (ref 5–15)
BUN: 17 mg/dL (ref 6–20)
CO2: 28 mmol/L (ref 22–32)
Calcium: 9.6 mg/dL (ref 8.9–10.3)
Chloride: 102 mmol/L (ref 98–111)
Creatinine, Ser: 0.64 mg/dL (ref 0.44–1.00)
GFR calc Af Amer: >60 mL/min (ref >60)
GFR calc non Af Amer: >60 mL/min (ref >60)
Glucose, Bld: 105 mg/dL — ABNORMAL HIGH (ref 70–99)
Potassium: 4 mmol/L (ref 3.5–5.1)
Sodium: 138 mmol/L (ref 135–145)
Total Bilirubin: 0.7 mg/dL (ref 0.3–1.2)
Total Protein: 7.3 g/dL (ref 6.5–8.1)

## 2019-04-05 LAB — HEMOGLOBIN A1C
Hgb A1c MFr Bld: 5.7 % — ABNORMAL HIGH (ref 4.8–5.6)
Mean Plasma Glucose: 116.89 mg/dL

## 2019-04-05 LAB — LIPID PANEL
Cholesterol: 226 mg/dL — ABNORMAL HIGH (ref 0–200)
HDL: 55 mg/dL (ref >40)
LDL Cholesterol: 156 mg/dL — ABNORMAL HIGH (ref 0–99)
Total CHOL/HDL Ratio: 4.1 RATIO
Triglycerides: 75 mg/dL (ref <150)
VLDL: 15 mg/dL (ref 0–40)

## 2019-04-05 LAB — TSH: TSH: 0.615 u[IU]/mL (ref 0.350–4.500)

## 2019-04-09 ENCOUNTER — Other Ambulatory Visit: Payer: Self-pay | Admitting: Student

## 2019-04-09 DIAGNOSIS — Z1239 Encounter for other screening for malignant neoplasm of breast: Secondary | ICD-10-CM

## 2019-04-12 LAB — IFOBT (OCCULT BLOOD): IFOBT: NEGATIVE

## 2019-04-21 ENCOUNTER — Ambulatory Visit (HOSPITAL_COMMUNITY)
Admission: RE | Admit: 2019-04-21 | Discharge: 2019-04-21 | Disposition: A | Discharge status: Home / Self Care | Payer: Self-pay | Source: Ambulatory Visit | Attending: Physician Assistant | Admitting: Physician Assistant

## 2019-04-21 ENCOUNTER — Other Ambulatory Visit: Payer: Self-pay

## 2019-04-21 DIAGNOSIS — Z1239 Encounter for other screening for malignant neoplasm of breast: Secondary | ICD-10-CM

## 2019-05-03 ENCOUNTER — Other Ambulatory Visit (HOSPITAL_COMMUNITY)
Admission: RE | Admit: 2019-05-03 | Discharge: 2019-05-03 | Disposition: A | Discharge status: Home / Self Care | Payer: Self-pay | Source: Ambulatory Visit | Attending: Physician Assistant | Admitting: Physician Assistant

## 2019-05-03 ENCOUNTER — Ambulatory Visit: Payer: Self-pay | Admitting: Physician Assistant

## 2019-05-03 ENCOUNTER — Encounter: Payer: Self-pay | Admitting: Physician Assistant

## 2019-05-03 ENCOUNTER — Other Ambulatory Visit: Payer: Self-pay

## 2019-05-03 VITALS — BP 114/76 | HR 99 | Temp 97.7°F

## 2019-05-03 DIAGNOSIS — I10 Essential (primary) hypertension: Secondary | ICD-10-CM

## 2019-05-03 DIAGNOSIS — Z124 Encounter for screening for malignant neoplasm of cervix: Secondary | ICD-10-CM | POA: Insufficient documentation

## 2019-05-03 DIAGNOSIS — F17200 Nicotine dependence, unspecified, uncomplicated: Secondary | ICD-10-CM

## 2019-05-03 DIAGNOSIS — E785 Hyperlipidemia, unspecified: Secondary | ICD-10-CM

## 2019-05-03 MED ORDER — ATORVASTATIN CALCIUM 20 MG PO TABS: 20.0000 mg | ORAL_TABLET | Freq: Every day | ORAL | 4 refills | Status: DC

## 2019-05-03 NOTE — Progress Notes (Signed)
BP 114/76   Pulse 99   Temp 97.7 F (36.5 C)   LMP 10/26/2010   SpO2 96%    Subjective:    Patient ID: Sarah Henderson, female    DOB: [birth year], 54 y.o.   MRN: [id number]  HPI: Sarah Henderson is a 54 y.o. female presenting on 05/03/2019 for No chief complaint on file.   HPI    Pt had a negative covid 19 screening questionnaire.     Pt is 53yoF with HTN who established care with this office last month.  She has no complaints today.   Relevant past medical, surgical, family and social history reviewed and updated as indicated. Interim medical history since our last visit reviewed. Allergies and medications reviewed and updated.   Current Outpatient Medications:  .  hydrochlorothiazide (HYDRODIURIL) 25 MG tablet, daily., Disp: , Rfl: 0    Review of Systems  Per HPI unless specifically indicated above     Objective:    BP 114/76   Pulse 99   Temp 97.7 F (36.5 C)   LMP 10/26/2010   SpO2 96%   Wt Readings from Last 3 Encounters:  03/29/19 161 lb (73 kg)  06/29/17 165 lb (74.8 kg)  06/21/17 167 lb (75.8 kg)    Physical Exam Vitals and nursing note reviewed.  Constitutional:      General: She is not in acute distress.    Appearance: Normal appearance. She is well-developed. She is not ill-appearing.  HENT:     Head: Normocephalic and atraumatic.  Cardiovascular:     Rate and Rhythm: Normal rate and regular rhythm.  Pulmonary:     Effort: Pulmonary effort is normal.     Breath sounds: Normal breath sounds.  Abdominal:     General: Bowel sounds are normal.     Palpations: Abdomen is soft. There is no mass.     Tenderness: There is no abdominal tenderness. There is no guarding or rebound.  Genitourinary:    Labia:        Right: No rash, tenderness or lesion.        Left: No rash, tenderness or lesion.      Vagina: Normal.     Cervix: No cervical motion tenderness, discharge or friability.     Adnexa:        Right: No mass, tenderness or fullness.          Left: No mass, tenderness or fullness.       Comments: Neysa Bonito assisted) Musculoskeletal:     Cervical back: Neck supple.     Right lower leg: No edema.     Left lower leg: No edema.  Lymphadenopathy:     Cervical: No cervical adenopathy.  Skin:    General: Skin is warm and dry.  Neurological:     Mental Status: She is alert and oriented to person, place, and time.  Psychiatric:        Behavior: Behavior normal.     Results for orders placed or performed during the hospital encounter of 04/05/19  Hemoglobin A1c  Result Value Ref Range   Hgb A1c MFr Bld 5.7 (H) 4.8 - 5.6 %   Mean Plasma Glucose 116.89 mg/dL  TSH  Result Value Ref Range   TSH 0.615 0.350 - 4.500 uIU/mL  Lipid panel  Result Value Ref Range   Cholesterol 226 (H) 0 - 200 mg/dL   Triglycerides 75 <237 mg/dL   HDL 55 >62 mg/dL   Total CHOL/HDL Ratio  4.1 RATIO   VLDL 15 0 - 40 mg/dL   LDL Cholesterol 156 (H) 0 - 99 mg/dL  Comprehensive metabolic panel  Result Value Ref Range   Sodium 138 135 - 145 mmol/L   Potassium 4.0 3.5 - 5.1 mmol/L   Chloride 102 98 - 111 mmol/L   CO2 28 22 - 32 mmol/L   Glucose, Bld 105 (H) 70 - 99 mg/dL   BUN 17 6 - 20 mg/dL   Creatinine, Ser 0.64 0.44 - 1.00 mg/dL   Calcium 9.6 8.9 - 10.3 mg/dL   Total Protein 7.3 6.5 - 8.1 g/dL   Albumin 4.1 3.5 - 5.0 g/dL   AST 16 15 - 41 U/L   ALT 22 0 - 44 U/L   Alkaline Phosphatase 55 38 - 126 U/L   Total Bilirubin 0.7 0.3 - 1.2 mg/dL   GFR calc non Af Amer >60 >60 mL/min   GFR calc Af Amer >60 >60 mL/min   Anion gap 8 5 - 15      Assessment & Plan:    Encounter Diagnoses  Name Primary?  . Essential hypertension Yes  . Routine cervical smear   . Tobacco use disorder   . Hyperlipidemia, unspecified hyperlipidemia type      -reviewed labs with pt -discussed dislipidemia and pt says she already eats healthy so will Start atorvastatin. -cont hctz for bp -encouraged smoking cessation -pt scheduled for covid vaccination -pt  to follow up 3 months.  She is to contact office sooner prn

## 2019-05-03 NOTE — Patient Instructions (Signed)

## 2019-05-05 LAB — CYTOLOGY - PAP
Comment: NEGATIVE
Diagnosis: NEGATIVE
High risk HPV: POSITIVE — AB

## 2019-07-03 IMAGING — DX DG ANKLE COMPLETE 3+V*L*
3 series · 3 of 3 positions shown · non-contrast
Comparison: None.

CLINICAL DATA: 51 y/o  F; bilateral ankle pain.  No known injury.

EXAM:
LEFT ANKLE COMPLETE - 3+ VIEW

[ankle ap]
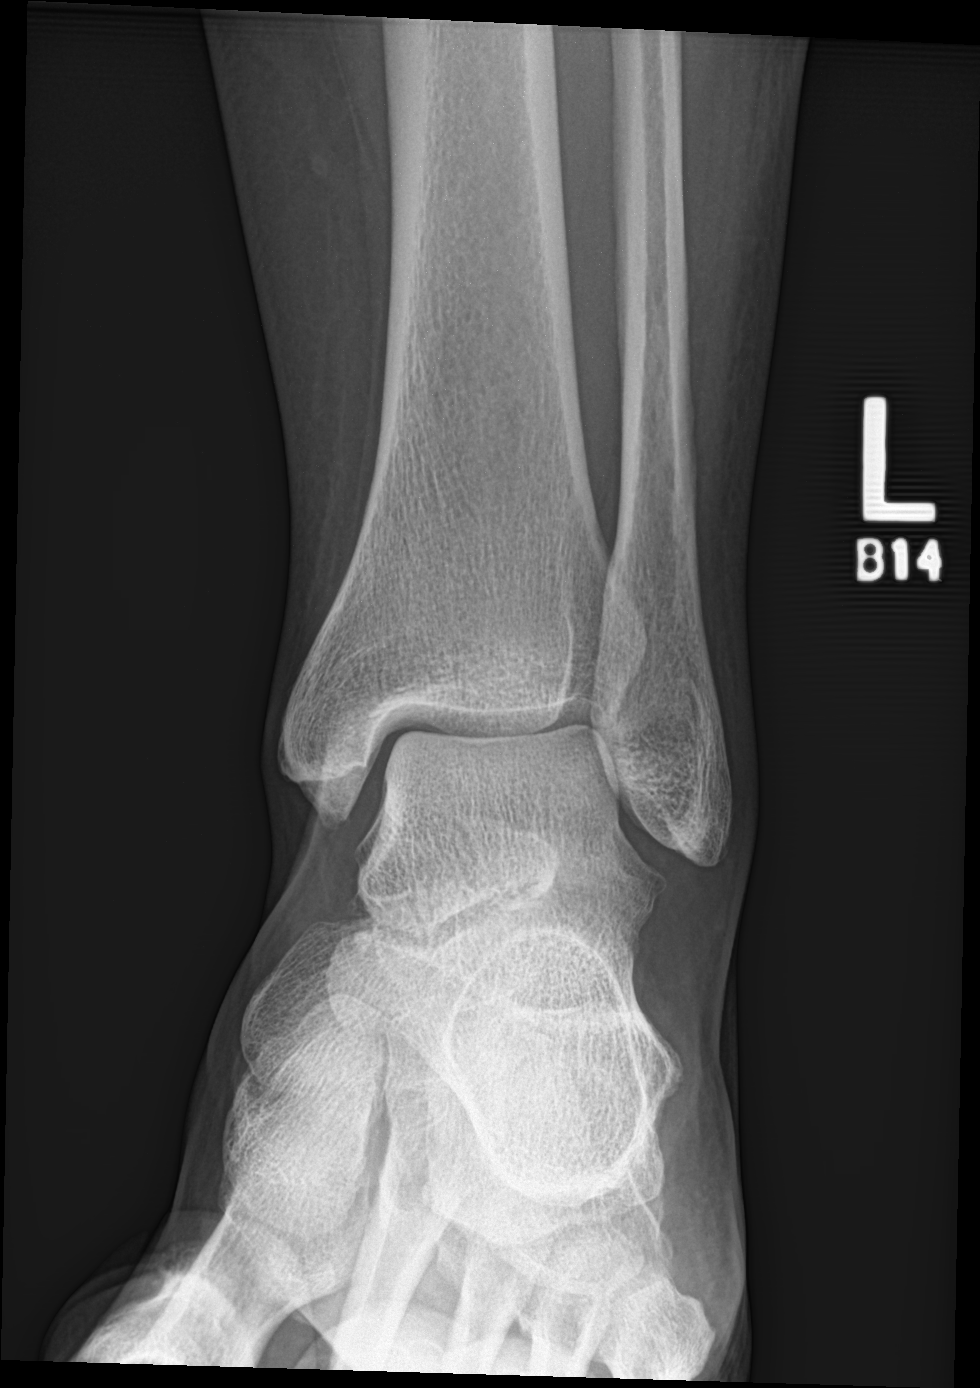

[ankle obl]
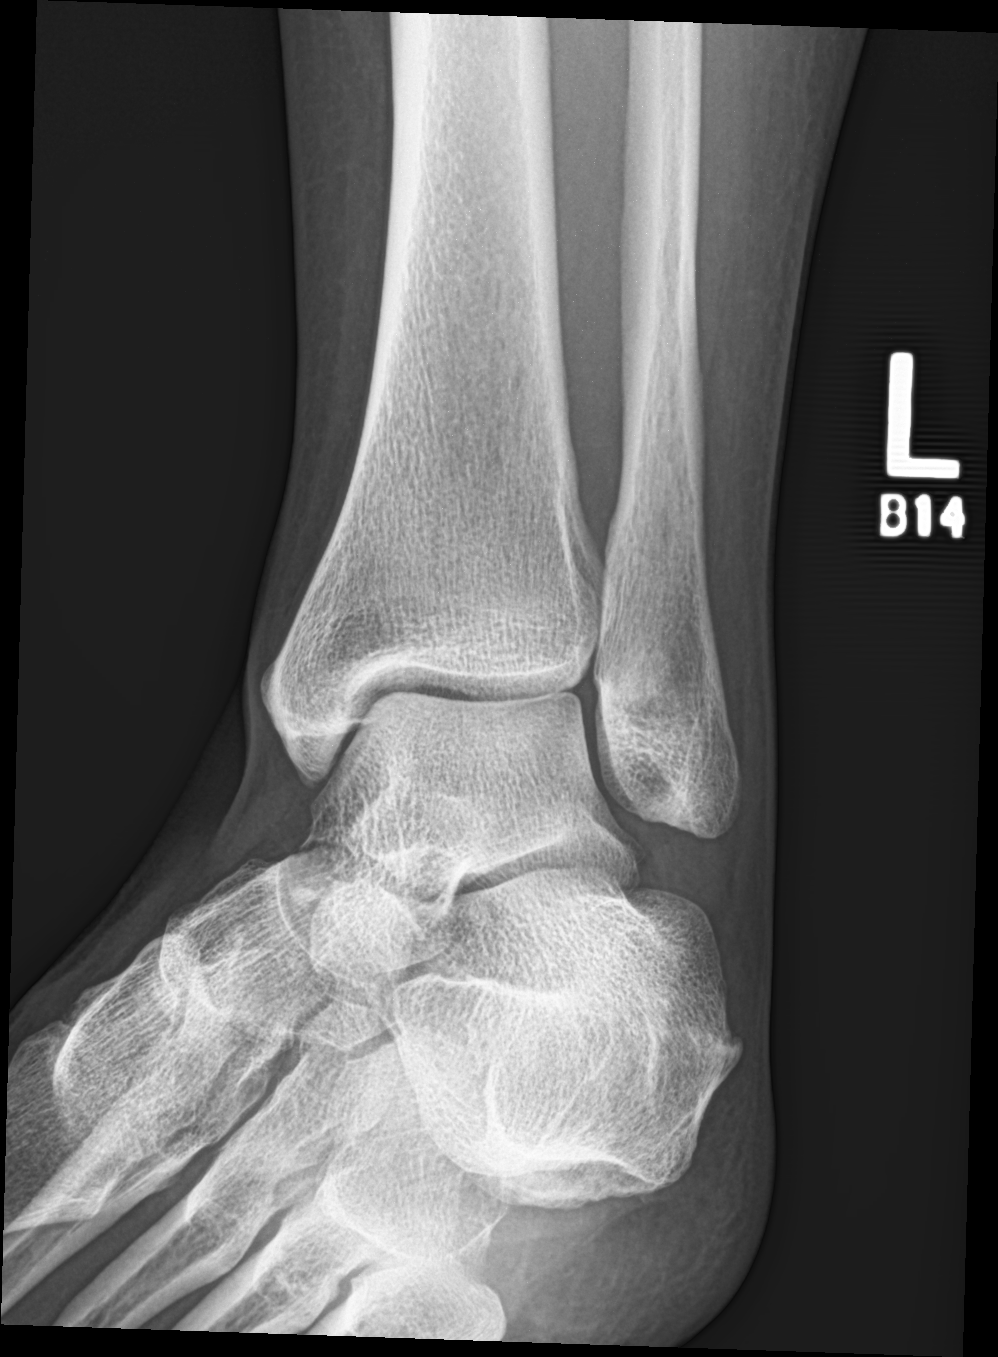

[ankle lat]
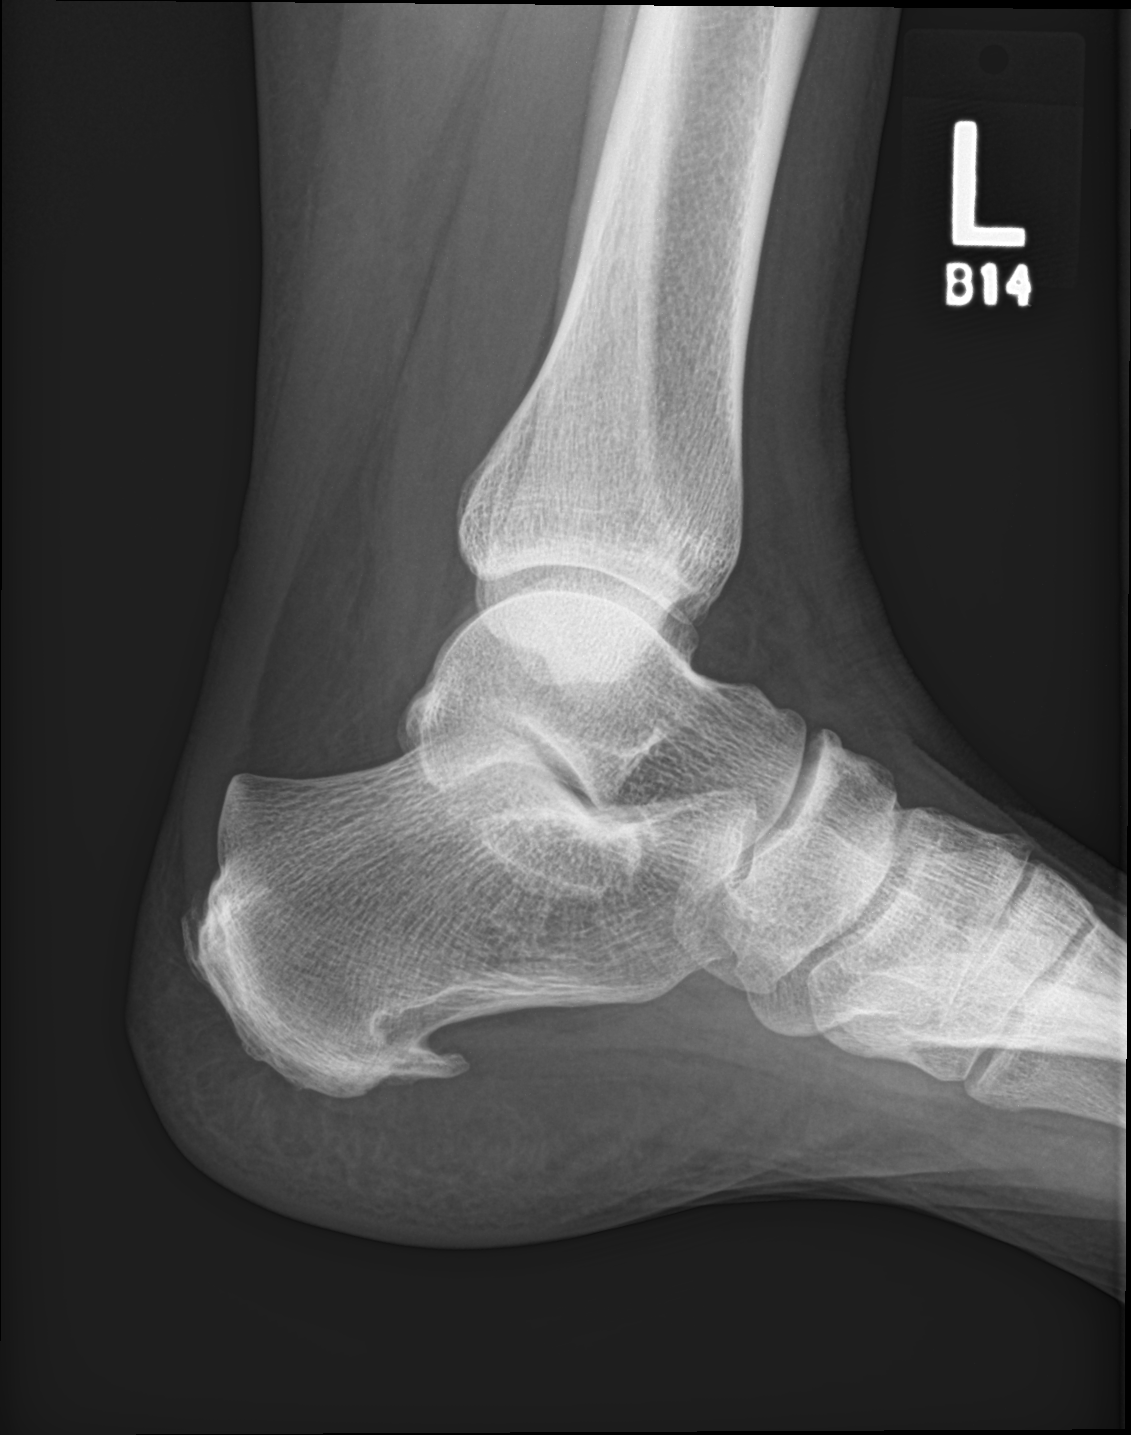

[3 of 3 positions shown; findings below may reference images not displayed]

FINDINGS: There is no evidence of fracture, dislocation, or joint effusion.
There is no evidence of arthropathy or other focal bone abnormality.
Small dorsal and large plantar calcaneal enthesophytes.
IMPRESSION: 1.  No acute fracture or dislocation identified.
2. Small dorsal and large plantar calcaneal enthesophytes.

By: Blade Aujla M.D.

## 2019-08-01 ENCOUNTER — Other Ambulatory Visit (HOSPITAL_COMMUNITY)
Admission: RE | Admit: 2019-08-01 | Discharge: 2019-08-01 | Disposition: A | Discharge status: Home / Self Care | Payer: Self-pay | Source: Ambulatory Visit | Attending: Physician Assistant | Admitting: Physician Assistant

## 2019-08-01 ENCOUNTER — Other Ambulatory Visit: Payer: Self-pay

## 2019-08-01 DIAGNOSIS — E785 Hyperlipidemia, unspecified: Secondary | ICD-10-CM | POA: Insufficient documentation

## 2019-08-01 LAB — COMPREHENSIVE METABOLIC PANEL
ALT: 19 U/L (ref 0–44)
AST: 15 U/L (ref 15–41)
Albumin: 3.7 g/dL (ref 3.5–5.0)
Alkaline Phosphatase: 55 U/L (ref 38–126)
Anion gap: 9 (ref 5–15)
BUN: 16 mg/dL (ref 6–20)
CO2: 28 mmol/L (ref 22–32)
Calcium: 9.3 mg/dL (ref 8.9–10.3)
Chloride: 104 mmol/L (ref 98–111)
Creatinine, Ser: 0.64 mg/dL (ref 0.44–1.00)
GFR calc Af Amer: >60 mL/min (ref >60)
GFR calc non Af Amer: >60 mL/min (ref >60)
Glucose, Bld: 101 mg/dL — ABNORMAL HIGH (ref 70–99)
Potassium: 3.9 mmol/L (ref 3.5–5.1)
Sodium: 141 mmol/L (ref 135–145)
Total Bilirubin: 0.8 mg/dL (ref 0.3–1.2)
Total Protein: 6.5 g/dL (ref 6.5–8.1)

## 2019-08-01 LAB — LIPID PANEL
Cholesterol: 126 mg/dL (ref 0–200)
HDL: 38 mg/dL — ABNORMAL LOW (ref >40)
LDL Cholesterol: 79 mg/dL (ref 0–99)
Total CHOL/HDL Ratio: 3.3 RATIO
Triglycerides: 46 mg/dL (ref <150)
VLDL: 9 mg/dL (ref 0–40)

## 2019-08-02 ENCOUNTER — Ambulatory Visit: Payer: Self-pay | Admitting: Physician Assistant

## 2019-08-02 ENCOUNTER — Encounter: Payer: Self-pay | Admitting: Physician Assistant

## 2019-08-02 DIAGNOSIS — I10 Essential (primary) hypertension: Secondary | ICD-10-CM

## 2019-08-02 DIAGNOSIS — E785 Hyperlipidemia, unspecified: Secondary | ICD-10-CM

## 2019-08-02 DIAGNOSIS — F17200 Nicotine dependence, unspecified, uncomplicated: Secondary | ICD-10-CM

## 2019-08-02 MED ORDER — ATORVASTATIN CALCIUM 20 MG PO TABS: 20.0000 mg | ORAL_TABLET | Freq: Every day | ORAL | 1 refills | Status: DC

## 2019-08-02 MED ORDER — HYDROCHLOROTHIAZIDE 25 MG PO TABS: 25.0000 mg | ORAL_TABLET | Freq: Every day | ORAL | 1 refills | Status: DC

## 2019-08-02 NOTE — Progress Notes (Signed)
LMP 10/26/2010    Subjective:    Patient ID: Sarah Henderson, female    DOB: Jan 14, 1966, 54 y.o.   MRN: [id number]  HPI: Sarah Henderson is a 54 y.o. female presenting on 08/02/2019 for No chief complaint on file.   HPI   This is a telemedicine appointment due to coronavirus pandemic.  It is via Telephone as pt was having difficulties getting connected through [employer].    I connected with  Sarah Henderson on 08/02/19 by a video enabled telemedicine application and verified that I am speaking with the correct person using two identifiers.   I discussed the limitations of evaluation and management by telemedicine. The patient expressed understanding and agreed to proceed.  Pt is at home.  Provider is at office.      Pt is 53yo with HTN and dyslipidemia.  She Checks her bp at [facility] she says it runs  115-128/70-75.  Pt says she Feels good and she has no complaints.  Pt is Not had covid vaccination.       Relevant past medical, surgical, family and social history reviewed and updated as indicated. Interim medical history since our last visit reviewed. Allergies and medications reviewed and updated.   Current Outpatient Medications:    atorvastatin (LIPITOR) 20 MG tablet, Take 1 tablet (20 mg total) by mouth daily., Disp: 30 tablet, Rfl: 4   hydrochlorothiazide (HYDRODIURIL) 25 MG tablet, daily., Disp: , Rfl: 0      Review of Systems  Per HPI unless specifically indicated above     Objective:    LMP 10/26/2010   Wt Readings from Last 3 Encounters:  03/29/19 161 lb (73 kg)  06/29/17 165 lb (74.8 kg)  06/21/17 167 lb (75.8 kg)    Physical Exam Pulmonary:     Effort: No respiratory distress.  Neurological:     Mental Status: She is alert and oriented to person, place, and time.  Psychiatric:        Speech: Speech normal.        Behavior: Behavior is cooperative.     Results for orders placed or performed during the hospital encounter of 08/01/19  Lipid panel   Result Value Ref Range   Cholesterol 126 0 - 200 mg/dL   Triglycerides 46 <629 mg/dL   HDL 38 (L) >47 mg/dL   Total CHOL/HDL Ratio 3.3 RATIO   VLDL 9 0 - 40 mg/dL   LDL Cholesterol 79 0 - 99 mg/dL  Comprehensive metabolic panel  Result Value Ref Range   Sodium 141 135 - 145 mmol/L   Potassium 3.9 3.5 - 5.1 mmol/L   Chloride 104 98 - 111 mmol/L   CO2 28 22 - 32 mmol/L   Glucose, Bld 101 (H) 70 - 99 mg/dL   BUN 16 6 - 20 mg/dL   Creatinine, Ser 6.54 0.44 - 1.00 mg/dL   Calcium 9.3 8.9 - 65.0 mg/dL   Total Protein 6.5 6.5 - 8.1 g/dL   Albumin 3.7 3.5 - 5.0 g/dL   AST 15 15 - 41 U/L   ALT 19 0 - 44 U/L   Alkaline Phosphatase 55 38 - 126 U/L   Total Bilirubin 0.8 0.3 - 1.2 mg/dL   GFR calc non Af Amer >60 >60 mL/min   GFR calc Af Amer >60 >60 mL/min   Anion gap 9 5 - 15      Assessment & Plan:   Encounter Diagnoses  Name Primary?   Essential hypertension Yes  Hyperlipidemia, unspecified hyperlipidemia type    Tobacco use disorder        -reviewed labs with pt -pt to continue current medications -pt to follow up in 3 months.  She is to contact office sooner prn

## 2019-11-07 ENCOUNTER — Encounter: Payer: Self-pay | Admitting: Physician Assistant

## 2019-11-07 ENCOUNTER — Other Ambulatory Visit: Payer: Self-pay

## 2019-11-07 ENCOUNTER — Other Ambulatory Visit (HOSPITAL_COMMUNITY)
Admission: RE | Admit: 2019-11-07 | Discharge: 2019-11-07 | Disposition: A | Discharge status: Home / Self Care | Payer: Self-pay | Source: Ambulatory Visit | Attending: Physician Assistant | Admitting: Physician Assistant

## 2019-11-07 ENCOUNTER — Ambulatory Visit: Payer: Self-pay | Admitting: Physician Assistant

## 2019-11-07 VITALS — BP 114/71 | HR 77 | Temp 97.4°F

## 2019-11-07 DIAGNOSIS — I10 Essential (primary) hypertension: Secondary | ICD-10-CM | POA: Insufficient documentation

## 2019-11-07 DIAGNOSIS — E785 Hyperlipidemia, unspecified: Secondary | ICD-10-CM

## 2019-11-07 DIAGNOSIS — F17200 Nicotine dependence, unspecified, uncomplicated: Secondary | ICD-10-CM

## 2019-11-07 LAB — COMPREHENSIVE METABOLIC PANEL
ALT: 36 U/L (ref 0–44)
AST: 21 U/L (ref 15–41)
Albumin: 4 g/dL (ref 3.5–5.0)
Alkaline Phosphatase: 57 U/L (ref 38–126)
Anion gap: 9 (ref 5–15)
BUN: 16 mg/dL (ref 6–20)
CO2: 29 mmol/L (ref 22–32)
Calcium: 9.6 mg/dL (ref 8.9–10.3)
Chloride: 101 mmol/L (ref 98–111)
Creatinine, Ser: 0.78 mg/dL (ref 0.44–1.00)
GFR, Estimated: >60 mL/min (ref >60)
Glucose, Bld: 108 mg/dL — ABNORMAL HIGH (ref 70–99)
Potassium: 4.4 mmol/L (ref 3.5–5.1)
Sodium: 139 mmol/L (ref 135–145)
Total Bilirubin: 0.7 mg/dL (ref 0.3–1.2)
Total Protein: 7.1 g/dL (ref 6.5–8.1)

## 2019-11-07 LAB — LIPID PANEL
Cholesterol: 135 mg/dL (ref 0–200)
HDL: 52 mg/dL (ref >40)
LDL Cholesterol: 75 mg/dL (ref 0–99)
Total CHOL/HDL Ratio: 2.6 RATIO
Triglycerides: 40 mg/dL (ref <150)
VLDL: 8 mg/dL (ref 0–40)

## 2019-11-07 NOTE — Progress Notes (Signed)
BP 114/71   Pulse 77   Temp (!) 97.4 F (36.3 C)   LMP 10/26/2010   SpO2 96%    Subjective:    Patient ID: Sarah Henderson, female    DOB: [birth year], 54 y.o.   MRN: [id number]  HPI: Sarah Henderson is a 54 y.o. female presenting on 11/07/2019 for No chief complaint on file.   HPI  Pt had a negative covid 19 screening questionnaire.    Pt is 54yoF who presents for routine follow up HTN and dyslipidemia.  She says she is doing great and she has no complaints.      Relevant past medical, surgical, family and social history reviewed and updated as indicated. Interim medical history since our last visit reviewed. Allergies and medications reviewed and updated.    Current Outpatient Medications:  .  atorvastatin (LIPITOR) 20 MG tablet, Take 1 tablet (20 mg total) by mouth daily., Disp: 90 tablet, Rfl: 1 .  hydrochlorothiazide (HYDRODIURIL) 25 MG tablet, Take 1 tablet (25 mg total) by mouth daily., Disp: 90 tablet, Rfl: 1     Review of Systems  Per HPI unless specifically indicated above     Objective:    BP 114/71   Pulse 77   Temp (!) 97.4 F (36.3 C)   LMP 10/26/2010   SpO2 96%   Wt Readings from Last 3 Encounters:  03/29/19 161 lb (73 kg)  06/29/17 165 lb (74.8 kg)  06/21/17 167 lb (75.8 kg)    Physical Exam Vitals reviewed.  Constitutional:      General: She is not in acute distress.    Appearance: She is well-developed. She is not ill-appearing.  HENT:     Head: Normocephalic and atraumatic.  Cardiovascular:     Rate and Rhythm: Normal rate and regular rhythm.  Pulmonary:     Effort: Pulmonary effort is normal.     Breath sounds: Normal breath sounds.  Abdominal:     General: Bowel sounds are normal.     Palpations: Abdomen is soft. There is no mass.     Tenderness: There is no abdominal tenderness.  Musculoskeletal:     Cervical back: Neck supple.     Right lower leg: No edema.     Left lower leg: No edema.  Lymphadenopathy:     Cervical: No  cervical adenopathy.  Skin:    General: Skin is warm and dry.  Neurological:     Mental Status: She is alert and oriented to person, place, and time.  Psychiatric:        Attention and Perception: Attention normal.        Speech: Speech normal.        Behavior: Behavior normal. Behavior is cooperative.     Results for orders placed or performed during the hospital encounter of 11/07/19  Comprehensive metabolic panel  Result Value Ref Range   Sodium 139 135 - 145 mmol/L   Potassium 4.4 3.5 - 5.1 mmol/L   Chloride 101 98 - 111 mmol/L   CO2 29 22 - 32 mmol/L   Glucose, Bld 108 (H) 70 - 99 mg/dL   BUN 16 6 - 20 mg/dL   Creatinine, Ser 0.35 0.44 - 1.00 mg/dL   Calcium 9.6 8.9 - 59.7 mg/dL   Total Protein 7.1 6.5 - 8.1 g/dL   Albumin 4.0 3.5 - 5.0 g/dL   AST 21 15 - 41 U/L   ALT 36 0 - 44 U/L   Alkaline Phosphatase 57  38 - 126 U/L   Total Bilirubin 0.7 0.3 - 1.2 mg/dL   GFR, Estimated >13 >14 mL/min   Anion gap 9 5 - 15  Lipid panel  Result Value Ref Range   Cholesterol 135 0 - 200 mg/dL   Triglycerides 40 <388 mg/dL   HDL 52 >87 mg/dL   Total CHOL/HDL Ratio 2.6 RATIO   VLDL 8 0 - 40 mg/dL   LDL Cholesterol 75 0 - 99 mg/dL      Assessment & Plan:    Encounter Diagnoses  Name Primary?  . Essential hypertension Yes  . Hyperlipidemia, unspecified hyperlipidemia type   . Tobacco use disorder     -reviewed labs with pt -pt to continue current medications -educated and encouaraged to get covid vaccination -encouraged smoking cessation -pt to follow up 3 months.  She is to contact office sooner prn

## 2020-01-30 ENCOUNTER — Other Ambulatory Visit: Payer: Self-pay | Admitting: Physician Assistant

## 2020-02-07 ENCOUNTER — Ambulatory Visit: Payer: Self-pay | Admitting: Physician Assistant

## 2020-02-07 ENCOUNTER — Encounter: Payer: Self-pay | Admitting: Physician Assistant

## 2020-02-07 VITALS — BP 108/72 | HR 87

## 2020-02-07 DIAGNOSIS — E785 Hyperlipidemia, unspecified: Secondary | ICD-10-CM

## 2020-02-07 DIAGNOSIS — F17200 Nicotine dependence, unspecified, uncomplicated: Secondary | ICD-10-CM

## 2020-02-07 DIAGNOSIS — I10 Essential (primary) hypertension: Secondary | ICD-10-CM

## 2020-02-07 MED ORDER — HYDROCHLOROTHIAZIDE 25 MG PO TABS
25.0000 mg | ORAL_TABLET | Freq: Every day | ORAL | 4 refills | Status: DC
Start: 2020-02-07 — End: 2020-04-02

## 2020-02-07 MED ORDER — ATORVASTATIN CALCIUM 20 MG PO TABS
20.0000 mg | ORAL_TABLET | Freq: Every day | ORAL | 4 refills | Status: DC
Start: 2020-02-07 — End: 2020-04-02

## 2020-02-07 NOTE — Progress Notes (Unsigned)
   BP 108/72   Pulse 87   LMP 10/26/2010    Subjective:    Patient ID: Sarah Henderson, female    DOB: September 13, 1965, 55 y.o.   MRN: [id number]  HPI: Sarah Henderson is a 55 y.o. female presenting on 02/07/2020 for No chief complaint on file.   HPI    This is a telemedicine appointment due to coronavirus pandemic.  It is via telephone as pt was having difficulty getting connected through [employer].  I connected with  Sarah Henderson on 02/07/20 by a video enabled telemedicine application and verified that I am speaking with the correct person using two identifiers.   I discussed the limitations of evaluation and management by telemedicine. The patient expressed understanding and agreed to proceed.  Pt is at home.  Provider is working from home office.     Pt is 54yoF with HTN and dyslipidemia. She has not had an opportunity to get her labs drawn.   She is working on cutting back on smoking.    She Has not yet gotten covid vaccination.   She has no complaints today.       Relevant past medical, surgical, family and social history reviewed and updated as indicated. Interim medical history since our last visit reviewed. Allergies and medications reviewed and updated.   Current Outpatient Medications:  .  atorvastatin (LIPITOR) 20 MG tablet, Take 1 tablet by mouth once daily, Disp: 30 tablet, Rfl: 0 .  hydrochlorothiazide (HYDRODIURIL) 25 MG tablet, Take 1 tablet (25 mg total) by mouth daily., Disp: 90 tablet, Rfl: 1     Review of Systems  Per HPI unless specifically indicated above     Objective:    BP 108/72   Pulse 87   LMP 10/26/2010   Wt Readings from Last 3 Encounters:  03/29/19 161 lb (73 kg)  06/29/17 165 lb (74.8 kg)  06/21/17 167 lb (75.8 kg)    Physical Exam Constitutional:      General: She is not in acute distress. Pulmonary:     Effort: No respiratory distress.     Comments: Pt is speaking in complete sentences without stopping for breath.   Neurological:     Mental Status: She is alert and oriented to person, place, and time.  Psychiatric:        Attention and Perception: Attention normal.        Mood and Affect: Mood normal.        Speech: Speech normal.        Behavior: Behavior is cooperative.              Assessment & Plan:    Encounter Diagnoses  Name Primary?  . Essential hypertension Yes  . Hyperlipidemia, unspecified hyperlipidemia type   . Tobacco use disorder        -Pt to get her fasting labs drawn.  She will be called with results.   -Pt will continue current medications -educated pt and encouraged her to get covid vaccination -encouraged smoking cessation -Pt will follow up in 3 months.  She is to contact office sooner prn

## 2020-02-14 ENCOUNTER — Other Ambulatory Visit: Payer: Self-pay

## 2020-02-14 ENCOUNTER — Other Ambulatory Visit (HOSPITAL_COMMUNITY)
Admission: RE | Admit: 2020-02-14 | Discharge: 2020-02-14 | Disposition: A | Discharge status: Home / Self Care | Payer: Self-pay | Source: Ambulatory Visit | Attending: Physician Assistant | Admitting: Physician Assistant

## 2020-02-14 DIAGNOSIS — E785 Hyperlipidemia, unspecified: Secondary | ICD-10-CM | POA: Insufficient documentation

## 2020-02-14 DIAGNOSIS — I10 Essential (primary) hypertension: Secondary | ICD-10-CM | POA: Insufficient documentation

## 2020-02-14 LAB — COMPREHENSIVE METABOLIC PANEL
ALT: 35 U/L (ref 0–44)
AST: 19 U/L (ref 15–41)
Albumin: 4 g/dL (ref 3.5–5.0)
Alkaline Phosphatase: 57 U/L (ref 38–126)
Anion gap: 6 (ref 5–15)
BUN: 13 mg/dL (ref 6–20)
CO2: 30 mmol/L (ref 22–32)
Calcium: 9.6 mg/dL (ref 8.9–10.3)
Chloride: 103 mmol/L (ref 98–111)
Creatinine, Ser: 0.68 mg/dL (ref 0.44–1.00)
GFR, Estimated: >60 mL/min (ref >60)
Glucose, Bld: 114 mg/dL — ABNORMAL HIGH (ref 70–99)
Potassium: 4.2 mmol/L (ref 3.5–5.1)
Sodium: 139 mmol/L (ref 135–145)
Total Bilirubin: 0.5 mg/dL (ref 0.3–1.2)
Total Protein: 6.8 g/dL (ref 6.5–8.1)

## 2020-02-14 LAB — LIPID PANEL
Cholesterol: 141 mg/dL (ref 0–200)
HDL: 58 mg/dL (ref >40)
LDL Cholesterol: 78 mg/dL (ref 0–99)
Total CHOL/HDL Ratio: 2.4 RATIO
Triglycerides: 27 mg/dL (ref <150)
VLDL: 5 mg/dL (ref 0–40)

## 2020-04-02 ENCOUNTER — Other Ambulatory Visit: Payer: Self-pay | Admitting: Physician Assistant

## 2020-04-02 MED ORDER — ATORVASTATIN CALCIUM 20 MG PO TABS
20.0000 mg | ORAL_TABLET | Freq: Every day | ORAL | 0 refills | Status: DC
Start: 2020-04-02 — End: 2020-06-24

## 2020-04-02 MED ORDER — HYDROCHLOROTHIAZIDE 25 MG PO TABS
25.0000 mg | ORAL_TABLET | Freq: Every day | ORAL | 0 refills | Status: DC
Start: 2020-04-02 — End: 2020-06-24

## 2020-04-22 ENCOUNTER — Other Ambulatory Visit: Payer: Self-pay | Admitting: Physician Assistant

## 2020-04-22 DIAGNOSIS — E785 Hyperlipidemia, unspecified: Secondary | ICD-10-CM

## 2020-04-22 DIAGNOSIS — R7309 Other abnormal glucose: Secondary | ICD-10-CM

## 2020-04-22 DIAGNOSIS — Z131 Encounter for screening for diabetes mellitus: Secondary | ICD-10-CM

## 2020-04-22 DIAGNOSIS — I10 Essential (primary) hypertension: Secondary | ICD-10-CM

## 2020-05-03 ENCOUNTER — Other Ambulatory Visit: Payer: Self-pay

## 2020-05-03 ENCOUNTER — Other Ambulatory Visit (HOSPITAL_COMMUNITY)
Admission: RE | Admit: 2020-05-03 | Discharge: 2020-05-03 | Disposition: A | Discharge status: Home / Self Care | Payer: Self-pay | Source: Ambulatory Visit | Attending: Physician Assistant | Admitting: Physician Assistant

## 2020-05-03 DIAGNOSIS — I10 Essential (primary) hypertension: Secondary | ICD-10-CM | POA: Insufficient documentation

## 2020-05-03 DIAGNOSIS — R7309 Other abnormal glucose: Secondary | ICD-10-CM | POA: Insufficient documentation

## 2020-05-03 DIAGNOSIS — E785 Hyperlipidemia, unspecified: Secondary | ICD-10-CM | POA: Insufficient documentation

## 2020-05-03 DIAGNOSIS — Z131 Encounter for screening for diabetes mellitus: Secondary | ICD-10-CM | POA: Insufficient documentation

## 2020-05-03 LAB — COMPREHENSIVE METABOLIC PANEL
ALT: 44 U/L (ref 0–44)
AST: 23 U/L (ref 15–41)
Albumin: 4.2 g/dL (ref 3.5–5.0)
Alkaline Phosphatase: 69 U/L (ref 38–126)
Anion gap: 10 (ref 5–15)
BUN: 15 mg/dL (ref 6–20)
CO2: 29 mmol/L (ref 22–32)
Calcium: 9.4 mg/dL (ref 8.9–10.3)
Chloride: 101 mmol/L (ref 98–111)
Creatinine, Ser: 0.74 mg/dL (ref 0.44–1.00)
GFR, Estimated: >60 mL/min (ref >60)
Glucose, Bld: 99 mg/dL (ref 70–99)
Potassium: 3.9 mmol/L (ref 3.5–5.1)
Sodium: 140 mmol/L (ref 135–145)
Total Bilirubin: 0.9 mg/dL (ref 0.3–1.2)
Total Protein: 7.4 g/dL (ref 6.5–8.1)

## 2020-05-03 LAB — LIPID PANEL
Cholesterol: 144 mg/dL (ref 0–200)
HDL: 59 mg/dL (ref >40)
LDL Cholesterol: 79 mg/dL (ref 0–99)
Total CHOL/HDL Ratio: 2.4 RATIO
Triglycerides: 30 mg/dL (ref <150)
VLDL: 6 mg/dL (ref 0–40)

## 2020-05-03 LAB — HEMOGLOBIN A1C
Hgb A1c MFr Bld: 5.7 % — ABNORMAL HIGH (ref 4.8–5.6)
Mean Plasma Glucose: 117 mg/dL

## 2020-05-07 ENCOUNTER — Ambulatory Visit: Payer: Self-pay | Admitting: Physician Assistant

## 2020-05-07 ENCOUNTER — Encounter: Payer: Self-pay | Admitting: Physician Assistant

## 2020-05-07 ENCOUNTER — Other Ambulatory Visit: Payer: Self-pay | Admitting: Physician Assistant

## 2020-05-07 VITALS — BP 122/82 | HR 80 | Temp 96.6°F | Wt 163.0 lb

## 2020-05-07 DIAGNOSIS — Z1211 Encounter for screening for malignant neoplasm of colon: Secondary | ICD-10-CM

## 2020-05-07 DIAGNOSIS — I10 Essential (primary) hypertension: Secondary | ICD-10-CM

## 2020-05-07 DIAGNOSIS — F17200 Nicotine dependence, unspecified, uncomplicated: Secondary | ICD-10-CM

## 2020-05-07 DIAGNOSIS — Z1239 Encounter for other screening for malignant neoplasm of breast: Secondary | ICD-10-CM

## 2020-05-07 DIAGNOSIS — E785 Hyperlipidemia, unspecified: Secondary | ICD-10-CM

## 2020-05-07 NOTE — Progress Notes (Signed)
BP 122/82   Pulse 80   Temp (!) 96.6 F (35.9 C)   Wt 163 lb (73.9 kg)   LMP 10/26/2010   SpO2 96%   BMI 29.34 kg/m    Subjective:    Patient ID: Sarah Henderson, female    DOB: 05-29-1965, 55 y.o.   MRN: [id number]  HPI: Sarah Henderson is a 55 y.o. female presenting on 05/07/2020 for No chief complaint on file.   HPI    Pt had a negative covid 19 screening questionnaire.   Pt is 54yoF who presents for routine follow up HTN and dyslipidemia.  She says she is doing well and Feels good.  She denies CP, sob; she has no complaints.  She has still not gotten covid vaccination.  She continues to smoke.  She is still working at the [employer].    Relevant past medical, surgical, family and social history reviewed and updated as indicated. Interim medical history since our last visit reviewed. Allergies and medications reviewed and updated.    Current Outpatient Medications:  .  atorvastatin (LIPITOR) 20 MG tablet, Take 1 tablet (20 mg total) by mouth daily., Disp: 90 tablet, Rfl: 0 .  hydrochlorothiazide (HYDRODIURIL) 25 MG tablet, Take 1 tablet (25 mg total) by mouth daily., Disp: 90 tablet, Rfl: 0      Review of Systems  Per HPI unless specifically indicated above     Objective:    BP 122/82   Pulse 80   Temp (!) 96.6 F (35.9 C)   Wt 163 lb (73.9 kg)   LMP 10/26/2010   SpO2 96%   BMI 29.34 kg/m   Wt Readings from Last 3 Encounters:  05/07/20 163 lb (73.9 kg)  03/29/19 161 lb (73 kg)  06/29/17 165 lb (74.8 kg)    Physical Exam Vitals reviewed.  Constitutional:      General: She is not in acute distress.    Appearance: She is well-developed.  HENT:     Head: Normocephalic and atraumatic.  Cardiovascular:     Rate and Rhythm: Normal rate and regular rhythm.  Pulmonary:     Effort: Pulmonary effort is normal.     Breath sounds: Normal breath sounds.  Abdominal:     General: Bowel sounds are normal.     Palpations: Abdomen is soft.  There is no mass.     Tenderness: There is no abdominal tenderness.  Musculoskeletal:     Cervical back: Neck supple.     Right lower leg: No edema.     Left lower leg: No edema.  Lymphadenopathy:     Cervical: No cervical adenopathy.  Skin:    General: Skin is warm and dry.  Neurological:     Mental Status: She is alert and oriented to person, place, and time.     Motor: No tremor.     Gait: Gait is intact.  Psychiatric:        Attention and Perception: Attention normal.        Mood and Affect: Mood normal.        Speech: Speech normal.        Behavior: Behavior normal. Behavior is cooperative.     Results for orders placed or performed during the hospital encounter of 05/03/20  Lipid panel  Result Value Ref Range   Cholesterol 144 0 - 200 mg/dL   Triglycerides 30 <299 mg/dL   HDL 59 >37 mg/dL   Total CHOL/HDL Ratio 2.4 RATIO  VLDL 6 0 - 40 mg/dL   LDL Cholesterol 79 0 - 99 mg/dL  Comprehensive metabolic panel  Result Value Ref Range   Sodium 140 135 - 145 mmol/L   Potassium 3.9 3.5 - 5.1 mmol/L   Chloride 101 98 - 111 mmol/L   CO2 29 22 - 32 mmol/L   Glucose, Bld 99 70 - 99 mg/dL   BUN 15 6 - 20 mg/dL   Creatinine, Ser 8.46 0.44 - 1.00 mg/dL   Calcium 9.4 8.9 - 96.2 mg/dL   Total Protein 7.4 6.5 - 8.1 g/dL   Albumin 4.2 3.5 - 5.0 g/dL   AST 23 15 - 41 U/L   ALT 44 0 - 44 U/L   Alkaline Phosphatase 69 38 - 126 U/L   Total Bilirubin 0.9 0.3 - 1.2 mg/dL   GFR, Estimated >95 >28 mL/min   Anion gap 10 5 - 15  Hemoglobin A1c  Result Value Ref Range   Hgb A1c MFr Bld 5.7 (H) 4.8 - 5.6 %   Mean Plasma Glucose 117 mg/dL      Assessment & Plan:     Encounter Diagnoses  Name Primary?  . Essential hypertension Yes  . Hyperlipidemia, unspecified hyperlipidemia type   . Tobacco use disorder   . Screening for colon cancer   . Encounter for screening for malignant neoplasm of breast, unspecified screening modality      -reviewed labs with pt -will refer for  screening Mammogram -pt is given colon cancer screening test/ifobt -pt to Continue current meds -pt is encouraged to get covid vaccination - encouraged smoking cessation -pt to follow up  3 months.  She is to contact office sooner prn

## 2020-05-27 ENCOUNTER — Ambulatory Visit (HOSPITAL_COMMUNITY): Payer: Self-pay

## 2020-05-30 ENCOUNTER — Ambulatory Visit (HOSPITAL_COMMUNITY)
Admission: RE | Admit: 2020-05-30 | Discharge: 2020-05-30 | Disposition: A | Discharge status: Home / Self Care | Payer: Self-pay | Source: Ambulatory Visit | Attending: Physician Assistant | Admitting: Physician Assistant

## 2020-05-30 DIAGNOSIS — Z1239 Encounter for other screening for malignant neoplasm of breast: Secondary | ICD-10-CM | POA: Insufficient documentation

## 2020-06-04 LAB — IFOBT (OCCULT BLOOD): IFOBT: NEGATIVE

## 2020-06-24 ENCOUNTER — Other Ambulatory Visit: Payer: Self-pay | Admitting: Physician Assistant

## 2020-07-25 ENCOUNTER — Other Ambulatory Visit: Payer: Self-pay | Admitting: Physician Assistant

## 2020-07-25 DIAGNOSIS — E785 Hyperlipidemia, unspecified: Secondary | ICD-10-CM

## 2020-07-25 DIAGNOSIS — I10 Essential (primary) hypertension: Secondary | ICD-10-CM

## 2020-08-06 ENCOUNTER — Ambulatory Visit: Payer: Self-pay | Admitting: Physician Assistant

## 2020-08-06 ENCOUNTER — Other Ambulatory Visit (HOSPITAL_COMMUNITY)
Admission: RE | Admit: 2020-08-06 | Discharge: 2020-08-06 | Disposition: A | Discharge status: Home / Self Care | Payer: Self-pay | Source: Ambulatory Visit | Attending: Physician Assistant | Admitting: Physician Assistant

## 2020-08-06 ENCOUNTER — Other Ambulatory Visit: Payer: Self-pay

## 2020-08-06 DIAGNOSIS — E785 Hyperlipidemia, unspecified: Secondary | ICD-10-CM | POA: Insufficient documentation

## 2020-08-06 DIAGNOSIS — I10 Essential (primary) hypertension: Secondary | ICD-10-CM | POA: Insufficient documentation

## 2020-08-06 LAB — COMPREHENSIVE METABOLIC PANEL
ALT: 24 U/L (ref 0–44)
AST: 17 U/L (ref 15–41)
Albumin: 3.9 g/dL (ref 3.5–5.0)
Alkaline Phosphatase: 57 U/L (ref 38–126)
Anion gap: 8 (ref 5–15)
BUN: 18 mg/dL (ref 6–20)
CO2: 28 mmol/L (ref 22–32)
Calcium: 9.3 mg/dL (ref 8.9–10.3)
Chloride: 103 mmol/L (ref 98–111)
Creatinine, Ser: 0.67 mg/dL (ref 0.44–1.00)
GFR, Estimated: >60 mL/min (ref >60)
Glucose, Bld: 115 mg/dL — ABNORMAL HIGH (ref 70–99)
Potassium: 4.4 mmol/L (ref 3.5–5.1)
Sodium: 139 mmol/L (ref 135–145)
Total Bilirubin: 0.8 mg/dL (ref 0.3–1.2)
Total Protein: 6.5 g/dL (ref 6.5–8.1)

## 2020-08-06 LAB — LIPID PANEL
Cholesterol: 138 mg/dL (ref 0–200)
HDL: 52 mg/dL (ref >40)
LDL Cholesterol: 78 mg/dL (ref 0–99)
Total CHOL/HDL Ratio: 2.7 RATIO
Triglycerides: 39 mg/dL (ref <150)
VLDL: 8 mg/dL (ref 0–40)

## 2020-08-08 ENCOUNTER — Other Ambulatory Visit: Payer: Self-pay

## 2020-08-08 ENCOUNTER — Encounter: Payer: Self-pay | Admitting: Physician Assistant

## 2020-08-08 ENCOUNTER — Ambulatory Visit: Payer: Self-pay | Admitting: Physician Assistant

## 2020-08-08 VITALS — BP 111/70 | HR 100 | Temp 97.9°F | Wt 165.0 lb

## 2020-08-08 DIAGNOSIS — F17200 Nicotine dependence, unspecified, uncomplicated: Secondary | ICD-10-CM

## 2020-08-08 DIAGNOSIS — I10 Essential (primary) hypertension: Secondary | ICD-10-CM

## 2020-08-08 DIAGNOSIS — E785 Hyperlipidemia, unspecified: Secondary | ICD-10-CM

## 2020-08-08 MED ORDER — HYDROCHLOROTHIAZIDE 25 MG PO TABS
ORAL_TABLET | ORAL | 2 refills | Status: DC
Start: 2020-08-08 — End: 2021-06-26

## 2020-08-08 MED ORDER — ATORVASTATIN CALCIUM 20 MG PO TABS
ORAL_TABLET | ORAL | 2 refills | Status: DC
Start: 2020-08-08 — End: 2021-06-26

## 2020-08-08 NOTE — Progress Notes (Signed)
BP 111/70   Pulse 100   Temp 97.9 F (36.6 C)   Wt 165 lb (74.8 kg)   LMP 10/26/2010   SpO2 97%   BMI 29.70 kg/m    Subjective:    Patient ID: Sarah Henderson, female    DOB: [birth year], 55 y.o.   MRN: [id number]  HPI: Sarah Henderson is a 55 y.o. female presenting on 08/08/2020 for Hypertension   HPI  Pt had a negative covid 19 screening questionnaire.  Chief Complaint  Patient presents with   Hypertension   Hyperlipidemia    Pt says she is doing well.  She is still working at [employer].  She reports no health issues.    Relevant past medical, surgical, family and social history reviewed and updated as indicated. Interim medical history since our last visit reviewed. Allergies and medications reviewed and updated.   Current Outpatient Medications:    atorvastatin (LIPITOR) 20 MG tablet, TAKE 1 Tablet BY MOUTH ONCE EVERY DAY, Disp: 90 tablet, Rfl: 2   hydrochlorothiazide (HYDRODIURIL) 25 MG tablet, TAKE 1 Tablet BY MOUTH ONCE EVERY DAY, Disp: 90 tablet, Rfl: 2    Review of Systems  Per HPI unless specifically indicated above     Objective:    BP 111/70   Pulse 100   Temp 97.9 F (36.6 C)   Wt 165 lb (74.8 kg)   LMP 10/26/2010   SpO2 97%   BMI 29.70 kg/m   Wt Readings from Last 3 Encounters:  08/08/20 165 lb (74.8 kg)  05/07/20 163 lb (73.9 kg)  03/29/19 161 lb (73 kg)    Physical Exam Vitals reviewed.  Constitutional:      General: She is not in acute distress.    Appearance: She is well-developed. She is not ill-appearing.  HENT:     Head: Normocephalic and atraumatic.  Cardiovascular:     Rate and Rhythm: Normal rate and regular rhythm.  Pulmonary:     Effort: Pulmonary effort is normal.     Breath sounds: Normal breath sounds.  Abdominal:     General: Bowel sounds are normal.     Palpations: Abdomen is soft. There is no mass.     Tenderness: There is no abdominal tenderness.  Musculoskeletal:     Cervical back: Neck supple.     Right  lower leg: No edema.     Left lower leg: No edema.  Lymphadenopathy:     Cervical: No cervical adenopathy.  Skin:    General: Skin is warm and dry.  Neurological:     Mental Status: She is alert and oriented to person, place, and time.  Psychiatric:        Behavior: Behavior normal.    Results for orders placed or performed during the hospital encounter of 08/06/20  Lipid panel  Result Value Ref Range   Cholesterol 138 0 - 200 mg/dL   Triglycerides 39 <371 mg/dL   HDL 52 >69 mg/dL   Total CHOL/HDL Ratio 2.7 RATIO   VLDL 8 0 - 40 mg/dL   LDL Cholesterol 78 0 - 99 mg/dL  Comprehensive metabolic panel  Result Value Ref Range   Sodium 139 135 - 145 mmol/L   Potassium 4.4 3.5 - 5.1 mmol/L   Chloride 103 98 - 111 mmol/L   CO2 28 22 - 32 mmol/L   Glucose, Bld 115 (H) 70 - 99 mg/dL   BUN 18 6 - 20 mg/dL   Creatinine, Ser 6.78 0.44 - 1.00  mg/dL   Calcium 9.3 8.9 - 88.4 mg/dL   Total Protein 6.5 6.5 - 8.1 g/dL   Albumin 3.9 3.5 - 5.0 g/dL   AST 17 15 - 41 U/L   ALT 24 0 - 44 U/L   Alkaline Phosphatase 57 38 - 126 U/L   Total Bilirubin 0.8 0.3 - 1.2 mg/dL   GFR, Estimated >16 >60 mL/min   Anion gap 8 5 - 15      Assessment & Plan:    Encounter Diagnoses  Name Primary?   Essential hypertension Yes   Hyperlipidemia, unspecified hyperlipidemia type    Tobacco use disorder     -reviewed labs with pt -pt to continue current medications -pt to follow up 6 months.  She is to contact office sooner prn

## 2020-10-02 ENCOUNTER — Ambulatory Visit: Payer: Self-pay | Admitting: Physician Assistant

## 2020-10-02 ENCOUNTER — Encounter: Payer: Self-pay | Admitting: Physician Assistant

## 2020-10-02 DIAGNOSIS — J069 Acute upper respiratory infection, unspecified: Secondary | ICD-10-CM

## 2020-10-02 DIAGNOSIS — F17200 Nicotine dependence, unspecified, uncomplicated: Secondary | ICD-10-CM

## 2020-10-02 DIAGNOSIS — Z20822 Contact with and (suspected) exposure to covid-19: Secondary | ICD-10-CM

## 2020-10-02 NOTE — Progress Notes (Signed)
   LMP 10/26/2010    Subjective:    Patient ID: Sarah Henderson, female    DOB: 1965-10-15, 55 y.o.   MRN: [id number]  HPI: Sarah Henderson is a 55 y.o. female presenting on 10/02/2020 for No chief complaint on file.   HPI  This is a telemedicine appointment.  It started through [employer] but was changed to telephone due to technical problems.    I connected with  Sarah Henderson on 10/02/20 by a video enabled telemedicine application and verified that I am speaking with the correct person using two identifiers.   I discussed the limitations of evaluation and management by telemedicine. The patient expressed understanding and agreed to proceed.  Pt is at home.  Provdier is at office.     Pt is 54yoF who is seen today for feeling sick.  Pt "Felt crappy this morning". She thinks it's allergies.  She has Lots of congestion.  She did a lot of cleaning at work.   She Felt cold but hot to her partner.  She Took tussi-DM.  She has slept most of today.  She is Feeling a bit better today.   She Also has HA.  She says her Temperature is normal  She is still smoking.  She has Not been vaccinated for covid. Home covid test negative x 1.      Relevant past medical, surgical, family and social history reviewed and updated as indicated. Interim medical history since our last visit reviewed. Allergies and medications reviewed and updated.   Current Outpatient Medications:    atorvastatin (LIPITOR) 20 MG tablet, TAKE 1 Tablet BY MOUTH ONCE EVERY DAY, Disp: 90 tablet, Rfl: 2   hydrochlorothiazide (HYDRODIURIL) 25 MG tablet, TAKE 1 Tablet BY MOUTH ONCE EVERY DAY, Disp: 90 tablet, Rfl: 2   Review of Systems  Per HPI unless specifically indicated above     Objective:    LMP 10/26/2010   Wt Readings from Last 3 Encounters:  08/08/20 165 lb (74.8 kg)  05/07/20 163 lb (73.9 kg)  03/29/19 161 lb (73 kg)    Physical Exam Constitutional:      General: She is not in acute distress.    Appearance:  She is not toxic-appearing.  HENT:     Head: Normocephalic and atraumatic.  Pulmonary:     Effort: No respiratory distress.     Comments: Pt is talking in complete sentences without dyspnea Neurological:     Mental Status: She is alert and oriented to person, place, and time.  Psychiatric:        Attention and Perception: Attention normal.        Speech: Speech normal.        Behavior: Behavior is cooperative.          Assessment & Plan:    Encounter Diagnoses  Name Primary?   Upper respiratory tract infection, unspecified type Yes   Suspected COVID-19 virus infection    Tobacco use disorder      -pt is encouraged to use Antihistamine -she is encouraged to Rest, drink plenty of fluids -she is encouraged to avoid smoking -she is encouraged to take 2nd covid test tomorrow.  Discussed false negatives with home covid tests -pt is emailed note for work.  Discussed that pt will need to stay out longer if her covid test is positive.  She states understanding

## 2020-11-17 ENCOUNTER — Emergency Department (HOSPITAL_COMMUNITY): Payer: Medicaid Other

## 2020-11-17 ENCOUNTER — Other Ambulatory Visit: Payer: Self-pay

## 2020-11-17 ENCOUNTER — Encounter (HOSPITAL_COMMUNITY): Payer: Self-pay

## 2020-11-17 ENCOUNTER — Emergency Department (HOSPITAL_COMMUNITY)
Admission: EM | Admit: 2020-11-17 | Discharge: 2020-11-17 | Disposition: A | Discharge status: Home / Self Care | Payer: Medicaid Other | Attending: Student | Admitting: Student

## 2020-11-17 DIAGNOSIS — Z20822 Contact with and (suspected) exposure to covid-19: Secondary | ICD-10-CM | POA: Diagnosis not present

## 2020-11-17 DIAGNOSIS — R062 Wheezing: Secondary | ICD-10-CM | POA: Diagnosis not present

## 2020-11-17 DIAGNOSIS — I10 Essential (primary) hypertension: Secondary | ICD-10-CM | POA: Insufficient documentation

## 2020-11-17 DIAGNOSIS — J3489 Other specified disorders of nose and nasal sinuses: Secondary | ICD-10-CM | POA: Insufficient documentation

## 2020-11-17 DIAGNOSIS — R0602 Shortness of breath: Secondary | ICD-10-CM | POA: Insufficient documentation

## 2020-11-17 DIAGNOSIS — F17210 Nicotine dependence, cigarettes, uncomplicated: Secondary | ICD-10-CM | POA: Insufficient documentation

## 2020-11-17 DIAGNOSIS — R059 Cough, unspecified: Secondary | ICD-10-CM | POA: Insufficient documentation

## 2020-11-17 DIAGNOSIS — J441 Chronic obstructive pulmonary disease with (acute) exacerbation: Secondary | ICD-10-CM

## 2020-11-17 LAB — CBC WITH DIFFERENTIAL/PLATELET
Abs Immature Granulocytes: 0.02 10*3/uL (ref 0.00–0.07)
Basophils Absolute: 0 10*3/uL (ref 0.0–0.1)
Basophils Relative: 0 %
Eosinophils Absolute: 0.1 10*3/uL (ref 0.0–0.5)
Eosinophils Relative: 1 %
HCT: 43.5 % (ref 36.0–46.0)
Hemoglobin: 14.7 g/dL (ref 12.0–15.0)
Immature Granulocytes: 0 %
Lymphocytes Relative: 27 %
Lymphs Abs: 1.4 10*3/uL (ref 0.7–4.0)
MCH: 31.7 pg (ref 26.0–34.0)
MCHC: 33.8 g/dL (ref 30.0–36.0)
MCV: 94 fL (ref 80.0–100.0)
Monocytes Absolute: 0.5 10*3/uL (ref 0.1–1.0)
Monocytes Relative: 9 %
Neutro Abs: 3.3 10*3/uL (ref 1.7–7.7)
Neutrophils Relative %: 63 %
Platelets: 213 10*3/uL (ref 150–400)
RBC: 4.63 MIL/uL (ref 3.87–5.11)
RDW: 12.9 % (ref 11.5–15.5)
WBC: 5.3 10*3/uL (ref 4.0–10.5)
nRBC: 0 % (ref 0.0–0.2)

## 2020-11-17 LAB — COMPREHENSIVE METABOLIC PANEL
ALT: 31 U/L (ref 0–44)
AST: 20 U/L (ref 15–41)
Albumin: 4.3 g/dL (ref 3.5–5.0)
Alkaline Phosphatase: 70 U/L (ref 38–126)
Anion gap: 7 (ref 5–15)
BUN: 9 mg/dL (ref 6–20)
CO2: 30 mmol/L (ref 22–32)
Calcium: 9.5 mg/dL (ref 8.9–10.3)
Chloride: 103 mmol/L (ref 98–111)
Creatinine, Ser: 0.52 mg/dL (ref 0.44–1.00)
GFR, Estimated: >60 mL/min (ref >60)
Glucose, Bld: 99 mg/dL (ref 70–99)
Potassium: 3.7 mmol/L (ref 3.5–5.1)
Sodium: 140 mmol/L (ref 135–145)
Total Bilirubin: 0.2 mg/dL — ABNORMAL LOW (ref 0.3–1.2)
Total Protein: 7.3 g/dL (ref 6.5–8.1)

## 2020-11-17 LAB — RESP PANEL BY RT-PCR (FLU A&B, COVID) ARPGX2
Influenza A by PCR: NEGATIVE
Influenza B by PCR: NEGATIVE
SARS Coronavirus 2 by RT PCR: NEGATIVE

## 2020-11-17 LAB — TROPONIN I (HIGH SENSITIVITY)
Troponin I (High Sensitivity): 2 ng/L (ref <18)
Troponin I (High Sensitivity): 2 ng/L (ref <18)

## 2020-11-17 LAB — BRAIN NATRIURETIC PEPTIDE: B Natriuretic Peptide: 9 pg/mL (ref 0.0–100.0)

## 2020-11-17 MED ORDER — PREDNISONE 10 MG PO TABS: 40.0000 mg | ORAL_TABLET | Freq: Every day | ORAL | 0 refills | Status: AC

## 2020-11-17 MED ORDER — METHYLPREDNISOLONE SODIUM SUCC 125 MG IJ SOLR
125.0000 mg | Freq: Once | INTRAMUSCULAR | Status: AC
  Administered 2020-11-17: 125 mg via INTRAVENOUS
  Filled 2020-11-17: qty 2

## 2020-11-17 MED ORDER — IPRATROPIUM-ALBUTEROL 0.5-2.5 (3) MG/3ML IN SOLN
3.0000 mL | Freq: Once | RESPIRATORY_TRACT | Status: AC
  Administered 2020-11-17: 3 mL via RESPIRATORY_TRACT
  Filled 2020-11-17: qty 3

## 2020-11-17 MED ORDER — ALBUTEROL SULFATE HFA 108 (90 BASE) MCG/ACT IN AERS: 1.0000 | INHALATION_SPRAY | Freq: Four times a day (QID) | RESPIRATORY_TRACT | 0 refills | Status: DC | PRN

## 2020-11-17 NOTE — ED Triage Notes (Signed)
Pt. States they have been having nasal congestion for a day and since than it's progressed into a cough and now they feel short of breath.

## 2020-11-17 NOTE — ED Provider Notes (Signed)
[facility] EMERGENCY DEPARTMENT Provider Note   CSN: [id number] Arrival date & time: 11/17/20  1122     History Chief Complaint  Patient presents with   Shortness of Breath    Sarah Henderson is a 55 y.o. female with PMH arthritis, HTN, tobacco abuse who presents emergency department for evaluation of cough, rhinorrhea and shortness of breath.  She states that she has frequent coughing fits that cause her to feel shortness of breath but she feels short of breath at rest as well.  She does not have a history of COPD that she knows of but she is a current smoker.  Denies chest pain, abdominal pain, nausea, vomiting, fever or other systemic symptoms.   Shortness of Breath Associated symptoms: cough and wheezing   Associated symptoms: no abdominal pain, no chest pain, no ear pain, no fever, no rash, no sore throat and no vomiting       Past Medical History:  Diagnosis Date   Arthritis    Depression    Hypertension     Patient Active Problem List   Diagnosis Date Noted   Multinodular goiter 06/21/2017    History reviewed. No pertinent surgical history.   OB History   No obstetric history on file.     Family History  Problem Relation Age of Onset   Hypertension Mother    Hyperlipidemia Mother    Kidney disease Mother    Cancer Mother        kidney   Depression Mother    Hypertension Father    Cancer Father        pancreatic CA    Social History   Tobacco Use   Smoking status: Every Day    Packs/day: 1.00    Years: 40.00    Pack years: 40.00    Types: Cigarettes   Smokeless tobacco: Never  Vaping Use   Vaping Use: Never used  Substance Use Topics   Alcohol use: No   Drug use: No    Home Medications Prior to Admission medications   Medication Sig Start Date End Date Taking? Authorizing Provider  atorvastatin (LIPITOR) 20 MG tablet TAKE 1 Tablet BY MOUTH ONCE EVERY DAY 08/08/20  Yes Jacquelin Hawking, PA-C  hydrochlorothiazide (HYDRODIURIL) 25 MG tablet  TAKE 1 Tablet BY MOUTH ONCE EVERY DAY 08/08/20  Yes Jacquelin Hawking, PA-C    Allergies    Amoxicillin-pot clavulanate  Review of Systems   Review of Systems  Constitutional:  Negative for chills and fever.  HENT:  Negative for ear pain and sore throat.   Eyes:  Negative for pain and visual disturbance.  Respiratory:  Positive for cough, shortness of breath and wheezing.   Cardiovascular:  Negative for chest pain and palpitations.  Gastrointestinal:  Negative for abdominal pain and vomiting.  Genitourinary:  Negative for dysuria and hematuria.  Musculoskeletal:  Negative for arthralgias and back pain.  Skin:  Negative for color change and rash.  Neurological:  Negative for seizures and syncope.  All other systems reviewed and are negative.  Physical Exam Updated Vital Signs BP 121/67   Pulse 90   Temp 97.8 F (36.6 C) (Oral)   Resp 17   Ht 5\' 2"  (1.575 m)   Wt 74.8 kg   LMP 10/26/2010   SpO2 99%   BMI 30.18 kg/m   Physical Exam Vitals and nursing note reviewed.  Constitutional:      General: She is not in acute distress.    Appearance: She is  well-developed.  HENT:     Head: Normocephalic and atraumatic.  Eyes:     Conjunctiva/sclera: Conjunctivae normal.  Cardiovascular:     Rate and Rhythm: Normal rate and regular rhythm.     Heart sounds: No murmur heard. Pulmonary:     Effort: Pulmonary effort is normal. No respiratory distress.     Breath sounds: Wheezing present.  Abdominal:     Palpations: Abdomen is soft.     Tenderness: There is no abdominal tenderness.  Musculoskeletal:     Cervical back: Neck supple.  Skin:    General: Skin is warm and dry.  Neurological:     Mental Status: She is alert.    ED Results / Procedures / Treatments   Labs (all labs ordered are listed, but only abnormal results are displayed) Labs Reviewed  COMPREHENSIVE METABOLIC PANEL - Abnormal; Notable for the following components:      Result Value   Total Bilirubin 0.2 (*)     All other components within normal limits  RESP PANEL BY RT-PCR (FLU A&B, COVID) ARPGX2  CBC WITH DIFFERENTIAL/PLATELET  BRAIN NATRIURETIC PEPTIDE  TROPONIN I (HIGH SENSITIVITY)  TROPONIN I (HIGH SENSITIVITY)    EKG None  Radiology DG Chest 2 View  Result Date: 11/17/2020 CLINICAL DATA:  Shortness of breath, nonproductive cough EXAM: CHEST - 2 VIEW COMPARISON:  11/07/2010 FINDINGS: Bilateral mild interstitial thickening. No focal consolidation. No pleural effusion or pneumothorax. Heart and mediastinal contours are unremarkable. No acute osseous abnormality. IMPRESSION: Bilateral mild interstitial thickening concerning for insertional infection including atypical viral infection versus interstitial edema. Electronically Signed   By: Elige Ko M.D.   On: 11/17/2020 12:35    Procedures Procedures   Medications Ordered in ED Medications  ipratropium-albuterol (DUONEB) 0.5-2.5 (3) MG/3ML nebulizer solution 3 mL (3 mLs Nebulization Given 11/17/20 1234)  methylPREDNISolone sodium succinate (SOLU-MEDROL) 125 mg/2 mL injection 125 mg (125 mg Intravenous Given 11/17/20 1240)    ED Course  I have reviewed the triage vital signs and the nursing notes.  Pertinent labs & imaging results that were available during my care of the patient were reviewed by me and considered in my medical decision making (see chart for details).    MDM Rules/Calculators/A&P                           Patient seen emergency department for evaluation of shortness of breath and cough.  Physical exam reveals significant bilateral and expiratory wheezing but is otherwise unremarkable.  Patient received a single DuoNeb and methylprednisolone and her symptoms improve dramatically.  Patient porting improvement of her breathing.  She will be provided with a albuterol inhaler here in the emergency department and discharged with a prescription for 4 more days of oral steroids.  CBC, chemistry, BNP and troponin  unremarkable.  Chest x-ray with atypical viral infection versus pulmonary edema.  I do not hear any crackles on exam and the patient has no evidence of peripheral edema.  Patient likely experiencing a COPD exacerbation worsened by an unknown upper respiratory infection.  COVID and flu are currently pending at time of discharge and patient was signed out to oncoming provider.  If COVID-positive, patient should be discharged with oral therapy. Final Clinical Impression(s) / ED Diagnoses Final diagnoses:  None    Rx / DC Orders ED Discharge Orders     None        Shaunita Seney, Wyn Forster, MD 11/17/20 [phone]

## 2020-11-19 ENCOUNTER — Ambulatory Visit: Payer: Self-pay | Admitting: Physician Assistant

## 2020-11-19 ENCOUNTER — Encounter: Payer: Self-pay | Admitting: Physician Assistant

## 2020-11-19 DIAGNOSIS — J441 Chronic obstructive pulmonary disease with (acute) exacerbation: Secondary | ICD-10-CM

## 2020-11-19 MED ORDER — AZITHROMYCIN 250 MG PO TABS: ORAL_TABLET | ORAL | 0 refills | Status: AC

## 2020-11-19 MED ORDER — ALBUTEROL SULFATE (2.5 MG/3ML) 0.083% IN NEBU: 2.5000 mg | INHALATION_SOLUTION | RESPIRATORY_TRACT | 1 refills | Status: AC | PRN

## 2020-11-19 MED ORDER — BENZONATATE 100 MG PO CAPS: ORAL_CAPSULE | ORAL | 2 refills | Status: DC

## 2020-11-19 NOTE — Progress Notes (Signed)
LMP 10/26/2010    Subjective:    Patient ID: Sarah Henderson, female    DOB: Jul 16, 1965, 55 y.o.   MRN: [id number]  HPI: Sarah Henderson is a 55 y.o. female presenting on 11/19/2020 for No chief complaint on file.   HPI   This is a telemedicine appointment.  It is via telephone as pt has had difficulty getting connected through [employer]  I connected with  Sarah Henderson on 11/19/20 by a video enabled telemedicine application and verified that I am speaking with the correct person using two identifiers.   I discussed the limitations of evaluation and management by telemedicine. The patient expressed understanding and agreed to proceed.  Pt is at home.  Provider is at office.    Pt was seen in ER on 10/30 and she called today saying she felt no better.  She is using the prednisone and albuterol MDI that she was given.   The notes from ER were reviewed.   Pt says she has A little bit of sob.  With coughing she gets really sob.  She hasn't smoked since she went to the ER.  She has Not been running fevers.  She Confirmed she is taking 4 tabs of prednisone daily.  She is wheezing.  She says the Neb helped a lot in the ER.  She has not been running fevers.       Relevant past medical, surgical, family and social history reviewed and updated as indicated. Interim medical history since our last visit reviewed. Allergies and medications reviewed and updated.   Current Outpatient Medications:    albuterol (VENTOLIN HFA) 108 (90 Base) MCG/ACT inhaler, Inhale 1-2 puffs into the lungs every 6 (six) hours as needed for wheezing or shortness of breath., Disp: 1 each, Rfl: 0   atorvastatin (LIPITOR) 20 MG tablet, TAKE 1 Tablet BY MOUTH ONCE EVERY DAY, Disp: 90 tablet, Rfl: 2   hydrochlorothiazide (HYDRODIURIL) 25 MG tablet, TAKE 1 Tablet BY MOUTH ONCE EVERY DAY, Disp: 90 tablet, Rfl: 2   predniSONE (DELTASONE) 10 MG tablet, Take 4 tablets (40 mg total) by mouth daily for 5 days., Disp: 20 tablet, Rfl:  0    Review of Systems  Per HPI unless specifically indicated above     Objective:    LMP 10/26/2010   Wt Readings from Last 3 Encounters:  11/17/20 165 lb (74.8 kg)  08/08/20 165 lb (74.8 kg)  05/07/20 163 lb (73.9 kg)    Physical Exam Pulmonary:     Effort: No respiratory distress.     Breath sounds: Wheezing present.     Comments: Pt is able to speak in complete sentences but expiratory wheezes are audible.  Neurological:     Mental Status: She is alert and oriented to person, place, and time.  Psychiatric:        Attention and Perception: Attention normal.        Speech: Speech normal.        Behavior: Behavior is cooperative.          Assessment & Plan:   Encounter Diagnosis  Name Primary?   COPD exacerbation (HCC) Yes     -discussed with pt options to keep her from needing to go back to the hospital.  Her daughter has nebulizer machine.  Pt is given rx for Albuterol nebs.  Discussed with pt that she can use them q 2 hour if needed but she needs to be awake of anxiety and jitteriness that can  be brought on by the nebs.  She says she is aware of this side effect.   -rx Zpack  -rx Tessalon -she should finish the prednisone given in ER -pt will be emailed note to be out of work this week -pt is urged to continue to avoid smoking -discussed that she should go to ER if her breathing worsens and the nebs aren't helping.  She is asked to notify office by Thursday how she is doing

## 2021-01-22 ENCOUNTER — Other Ambulatory Visit: Payer: Self-pay | Admitting: Physician Assistant

## 2021-01-22 DIAGNOSIS — I10 Essential (primary) hypertension: Secondary | ICD-10-CM

## 2021-01-22 DIAGNOSIS — E785 Hyperlipidemia, unspecified: Secondary | ICD-10-CM

## 2021-01-30 ENCOUNTER — Other Ambulatory Visit (HOSPITAL_COMMUNITY)
Admission: RE | Admit: 2021-01-30 | Discharge: 2021-01-30 | Disposition: A | Discharge status: Home / Self Care | Payer: Medicaid Other | Source: Ambulatory Visit | Attending: Physician Assistant | Admitting: Physician Assistant

## 2021-01-30 ENCOUNTER — Other Ambulatory Visit: Payer: Self-pay

## 2021-01-30 DIAGNOSIS — I10 Essential (primary) hypertension: Secondary | ICD-10-CM | POA: Diagnosis present

## 2021-01-30 DIAGNOSIS — R69 Illness, unspecified: Secondary | ICD-10-CM | POA: Diagnosis present

## 2021-01-30 DIAGNOSIS — E785 Hyperlipidemia, unspecified: Secondary | ICD-10-CM | POA: Insufficient documentation

## 2021-01-30 LAB — COMPREHENSIVE METABOLIC PANEL
ALT: 35 U/L (ref 0–44)
AST: 22 U/L (ref 15–41)
Albumin: 4 g/dL (ref 3.5–5.0)
Alkaline Phosphatase: 58 U/L (ref 38–126)
Anion gap: 5 (ref 5–15)
BUN: 17 mg/dL (ref 6–20)
CO2: 29 mmol/L (ref 22–32)
Calcium: 9.3 mg/dL (ref 8.9–10.3)
Chloride: 105 mmol/L (ref 98–111)
Creatinine, Ser: 0.62 mg/dL (ref 0.44–1.00)
GFR, Estimated: >60 mL/min (ref >60)
Glucose, Bld: 111 mg/dL — ABNORMAL HIGH (ref 70–99)
Potassium: 4.3 mmol/L (ref 3.5–5.1)
Sodium: 139 mmol/L (ref 135–145)
Total Bilirubin: 0.5 mg/dL (ref 0.3–1.2)
Total Protein: 6.7 g/dL (ref 6.5–8.1)

## 2021-01-30 LAB — LIPID PANEL
Cholesterol: 146 mg/dL (ref 0–200)
HDL: 55 mg/dL (ref >40)
LDL Cholesterol: 83 mg/dL (ref 0–99)
Total CHOL/HDL Ratio: 2.7 RATIO
Triglycerides: 39 mg/dL (ref <150)
VLDL: 8 mg/dL (ref 0–40)

## 2021-02-06 ENCOUNTER — Other Ambulatory Visit: Payer: Self-pay

## 2021-02-06 ENCOUNTER — Encounter: Payer: Self-pay | Admitting: Physician Assistant

## 2021-02-06 ENCOUNTER — Ambulatory Visit: Payer: Medicaid Other | Admitting: Physician Assistant

## 2021-02-06 VITALS — BP 133/84 | HR 78 | Temp 97.6°F | Wt 179.0 lb

## 2021-02-06 DIAGNOSIS — E785 Hyperlipidemia, unspecified: Secondary | ICD-10-CM

## 2021-02-06 DIAGNOSIS — F17200 Nicotine dependence, unspecified, uncomplicated: Secondary | ICD-10-CM

## 2021-02-06 DIAGNOSIS — I10 Essential (primary) hypertension: Secondary | ICD-10-CM

## 2021-02-06 DIAGNOSIS — R7303 Prediabetes: Secondary | ICD-10-CM

## 2021-02-06 DIAGNOSIS — E669 Obesity, unspecified: Secondary | ICD-10-CM

## 2021-02-06 NOTE — Congregational Nurse Program (Signed)
Referred pt to [facility] to be able to receive continued assistance with health care and to remain at current medicah home with [facility].   Denies any chief c/o  outside of disease management with PCP ° °Appt scheduled for 1.20.23 at 1:30pm  °[facility]  °[address] °[place], [place] °

## 2021-02-06 NOTE — Progress Notes (Signed)
BP 133/84    Pulse 78    Temp 97.6 F (36.4 C)    Wt 179 lb (81.2 kg)    LMP 10/26/2010    SpO2 99%    BMI 32.74 kg/m    Subjective:    Patient ID: Sarah Henderson, female    DOB: 10-Nov-1965, 56 y.o.   MRN: [id number]  HPI: Sarah Henderson is a 56 y.o. female presenting on 02/06/2021 for Hypertension and Hyperlipidemia   HPI   Chief Complaint  Patient presents with   Hypertension   Hyperlipidemia    She is still smoking but working on cutting back.  She has Not gotten covid vaccination yet but is considering it  She is Worried about her wife who is inactive/sedentary  Pt is Being active and doing well    Relevant past medical, surgical, family and social history reviewed and updated as indicated. Interim medical history since our last visit reviewed. Allergies and medications reviewed and updated.    Current Outpatient Medications:    albuterol (VENTOLIN HFA) 108 (90 Base) MCG/ACT inhaler, Inhale 1-2 puffs into the lungs every 6 (six) hours as needed for wheezing or shortness of breath., Disp: 1 each, Rfl: 0   atorvastatin (LIPITOR) 20 MG tablet, TAKE 1 Tablet BY MOUTH ONCE EVERY DAY, Disp: 90 tablet, Rfl: 2   hydrochlorothiazide (HYDRODIURIL) 25 MG tablet, TAKE 1 Tablet BY MOUTH ONCE EVERY DAY, Disp: 90 tablet, Rfl: 2   albuterol (PROVENTIL) (2.5 MG/3ML) 0.083% nebulizer solution, Take 3 mLs (2.5 mg total) by nebulization every 2 (two) hours as needed for wheezing or shortness of breath. (Patient not taking: Reported on 02/06/2021), Disp: 150 mL, Rfl: 1    Review of Systems  Per HPI unless specifically indicated above     Objective:    BP 133/84    Pulse 78    Temp 97.6 F (36.4 C)    Wt 179 lb (81.2 kg)    LMP 10/26/2010    SpO2 99%    BMI 32.74 kg/m   Wt Readings from Last 3 Encounters:  02/06/21 179 lb (81.2 kg)  11/17/20 165 lb (74.8 kg)  08/08/20 165 lb (74.8 kg)    Physical Exam Vitals reviewed.  Constitutional:      General: She is not in acute  distress.    Appearance: She is well-developed. She is not toxic-appearing.  HENT:     Head: Normocephalic and atraumatic.  Cardiovascular:     Rate and Rhythm: Normal rate and regular rhythm.  Pulmonary:     Effort: Pulmonary effort is normal.     Breath sounds: Normal breath sounds.  Abdominal:     General: Bowel sounds are normal.     Palpations: Abdomen is soft. There is no mass.     Tenderness: There is no abdominal tenderness.  Musculoskeletal:     Cervical back: Neck supple.     Right lower leg: No edema.     Left lower leg: No edema.  Lymphadenopathy:     Cervical: No cervical adenopathy.  Skin:    General: Skin is warm and dry.  Neurological:     Mental Status: She is alert and oriented to person, place, and time.  Psychiatric:        Behavior: Behavior normal.    Results for orders placed or performed during the hospital encounter of 01/30/21  Lipid panel  Result Value Ref Range   Cholesterol 146 0 - 200 mg/dL   Triglycerides 39 <  150 mg/dL   HDL 55 >40 mg/dL   Total CHOL/HDL Ratio 2.7 RATIO   VLDL 8 0 - 40 mg/dL   LDL Cholesterol 83 0 - 99 mg/dL  Comprehensive metabolic panel  Result Value Ref Range   Sodium 139 135 - 145 mmol/L   Potassium 4.3 3.5 - 5.1 mmol/L   Chloride 105 98 - 111 mmol/L   CO2 29 22 - 32 mmol/L   Glucose, Bld 111 (H) 70 - 99 mg/dL   BUN 17 6 - 20 mg/dL   Creatinine, Ser 0.62 0.44 - 1.00 mg/dL   Calcium 9.3 8.9 - 10.3 mg/dL   Total Protein 6.7 6.5 - 8.1 g/dL   Albumin 4.0 3.5 - 5.0 g/dL   AST 22 15 - 41 U/L   ALT 35 0 - 44 U/L   Alkaline Phosphatase 58 38 - 126 U/L   Total Bilirubin 0.5 0.3 - 1.2 mg/dL   GFR, Estimated >60 >60 mL/min   Anion gap 5 5 - 15      Assessment & Plan:    Encounter Diagnoses  Name Primary?   Essential hypertension Yes   Hyperlipidemia, unspecified hyperlipidemia type    Prediabetes    Obesity, unspecified classification, unspecified obesity type, unspecified whether serious comorbidity present     Tobacco use disorder      -Reviewed labs with pt -pt to continue current medications -encouraged pt to watch portions and limit sweets (to help with weight and prediabetes) -pt was educated and encouraged to get covid vaccination -counseled pt on supporting and encouraging her wife.  Discussed that she must accept that she cannot control her wife's actions. -pt to follow up  3 months.  She is to contact office sooner prn

## 2021-04-16 ENCOUNTER — Other Ambulatory Visit: Payer: Self-pay | Admitting: Physician Assistant

## 2021-04-16 DIAGNOSIS — R7303 Prediabetes: Secondary | ICD-10-CM

## 2021-04-16 DIAGNOSIS — E785 Hyperlipidemia, unspecified: Secondary | ICD-10-CM

## 2021-04-16 DIAGNOSIS — I10 Essential (primary) hypertension: Secondary | ICD-10-CM

## 2021-05-07 ENCOUNTER — Other Ambulatory Visit (HOSPITAL_COMMUNITY)
Admission: RE | Admit: 2021-05-07 | Discharge: 2021-05-07 | Disposition: A | Discharge status: Home / Self Care | Payer: Medicaid Other | Source: Ambulatory Visit | Attending: Physician Assistant | Admitting: Physician Assistant

## 2021-05-07 DIAGNOSIS — E785 Hyperlipidemia, unspecified: Secondary | ICD-10-CM | POA: Insufficient documentation

## 2021-05-07 DIAGNOSIS — I10 Essential (primary) hypertension: Secondary | ICD-10-CM | POA: Insufficient documentation

## 2021-05-07 DIAGNOSIS — R7303 Prediabetes: Secondary | ICD-10-CM | POA: Diagnosis present

## 2021-05-07 LAB — LIPID PANEL
Cholesterol: 144 mg/dL (ref 0–200)
HDL: 55 mg/dL (ref >40)
LDL Cholesterol: 83 mg/dL (ref 0–99)
Total CHOL/HDL Ratio: 2.6 RATIO
Triglycerides: 32 mg/dL (ref <150)
VLDL: 6 mg/dL (ref 0–40)

## 2021-05-07 LAB — COMPREHENSIVE METABOLIC PANEL
ALT: 23 U/L (ref 0–44)
AST: 17 U/L (ref 15–41)
Albumin: 4 g/dL (ref 3.5–5.0)
Alkaline Phosphatase: 60 U/L (ref 38–126)
Anion gap: 7 (ref 5–15)
BUN: 16 mg/dL (ref 6–20)
CO2: 29 mmol/L (ref 22–32)
Calcium: 9.4 mg/dL (ref 8.9–10.3)
Chloride: 105 mmol/L (ref 98–111)
Creatinine, Ser: 0.57 mg/dL (ref 0.44–1.00)
GFR, Estimated: >60 mL/min (ref >60)
Glucose, Bld: 106 mg/dL — ABNORMAL HIGH (ref 70–99)
Potassium: 4.3 mmol/L (ref 3.5–5.1)
Sodium: 141 mmol/L (ref 135–145)
Total Bilirubin: 0.5 mg/dL (ref 0.3–1.2)
Total Protein: 6.9 g/dL (ref 6.5–8.1)

## 2021-05-07 LAB — HEMOGLOBIN A1C
Hgb A1c MFr Bld: 5.7 % — ABNORMAL HIGH (ref 4.8–5.6)
Mean Plasma Glucose: 116.89 mg/dL

## 2021-05-08 ENCOUNTER — Ambulatory Visit: Payer: Medicaid Other | Admitting: Physician Assistant

## 2021-05-15 ENCOUNTER — Ambulatory Visit: Payer: Medicaid Other | Admitting: Physician Assistant

## 2021-06-26 ENCOUNTER — Other Ambulatory Visit: Payer: Self-pay | Admitting: Physician Assistant

## 2021-06-26 MED ORDER — HYDROCHLOROTHIAZIDE 25 MG PO TABS: ORAL_TABLET | ORAL | 0 refills | Status: DC

## 2021-06-26 MED ORDER — ATORVASTATIN CALCIUM 20 MG PO TABS: ORAL_TABLET | ORAL | 0 refills | Status: DC

## 2021-07-09 IMAGING — MG DIGITAL SCREENING BILAT W/ TOMO W/ CAD
8 series · 8 of 24 positions shown · non-contrast
Comparison: Previous exam(s).

CLINICAL DATA: Screening.

EXAM:
DIGITAL SCREENING BILATERAL MAMMOGRAM WITH TOMO AND CAD

[L MLO synth-2D]
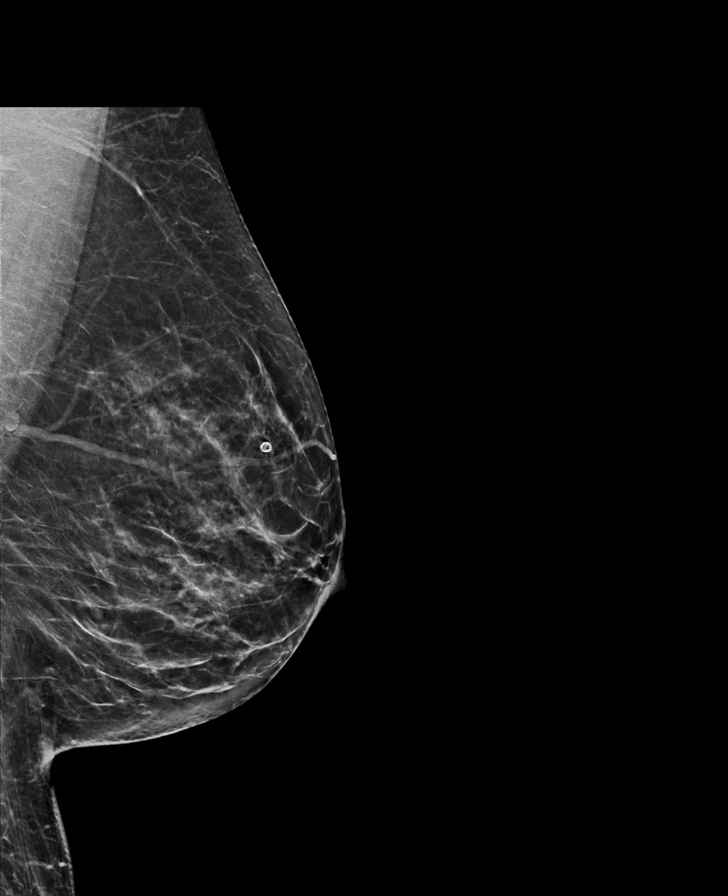

[L CC synth-2D]
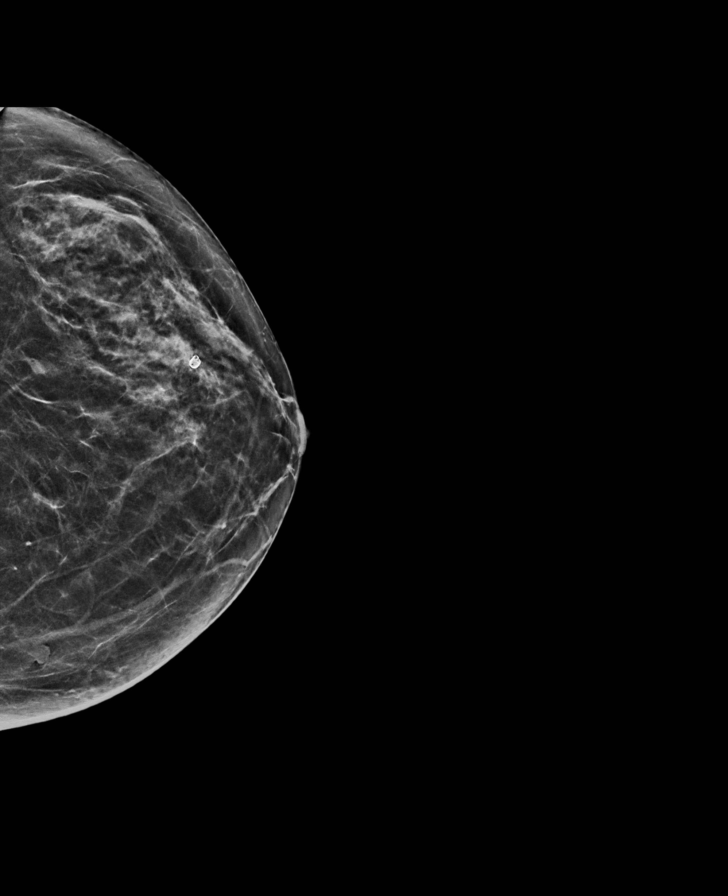

[R CC synth-2D]
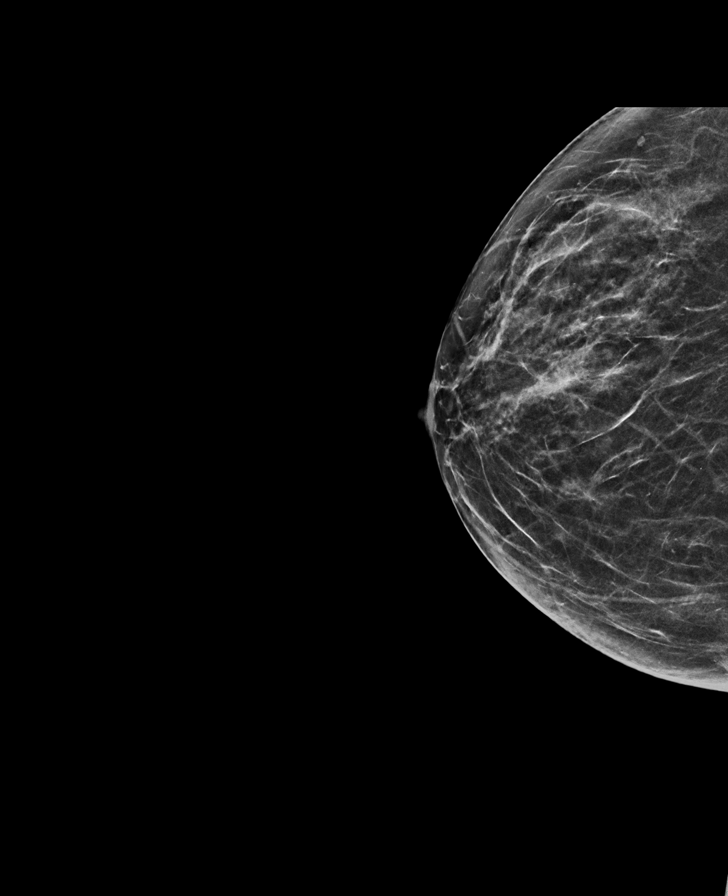

[R MLO synth-2D]
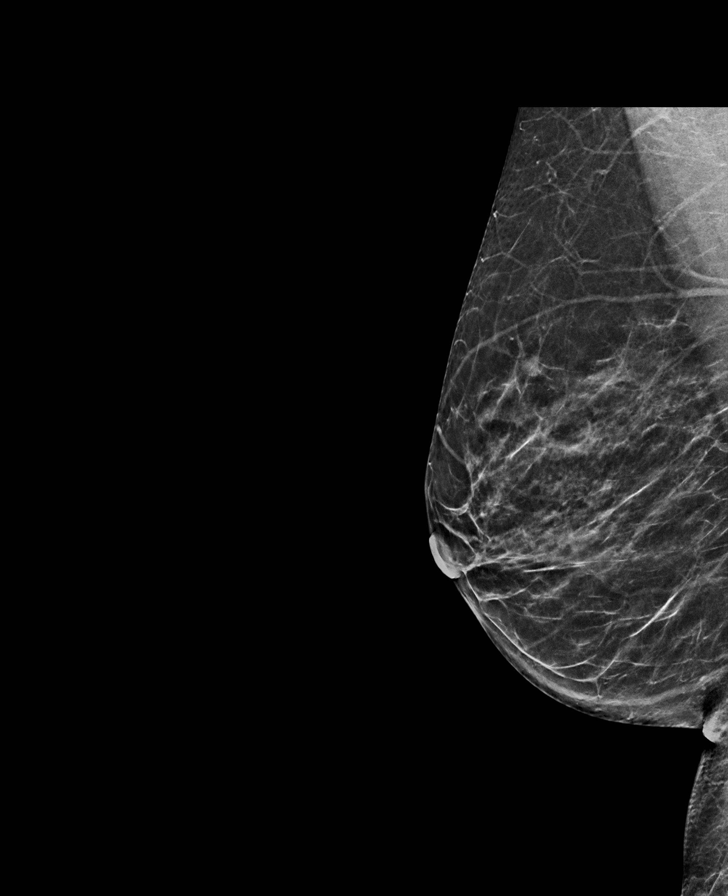

[L CC tomo · tomo slice 30/59.0]
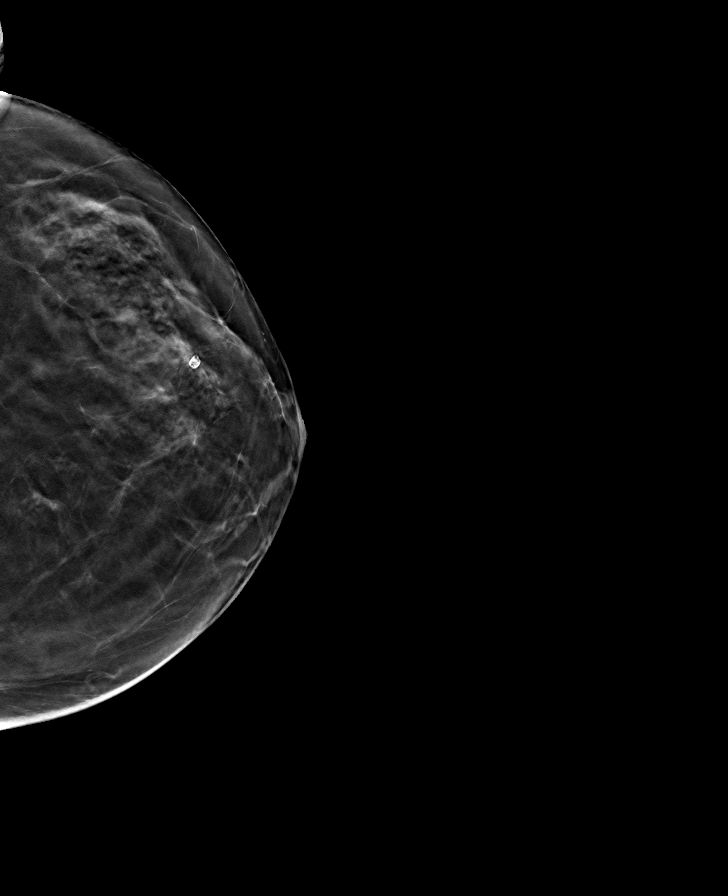

[L MLO tomo · tomo slice 33/66.0]
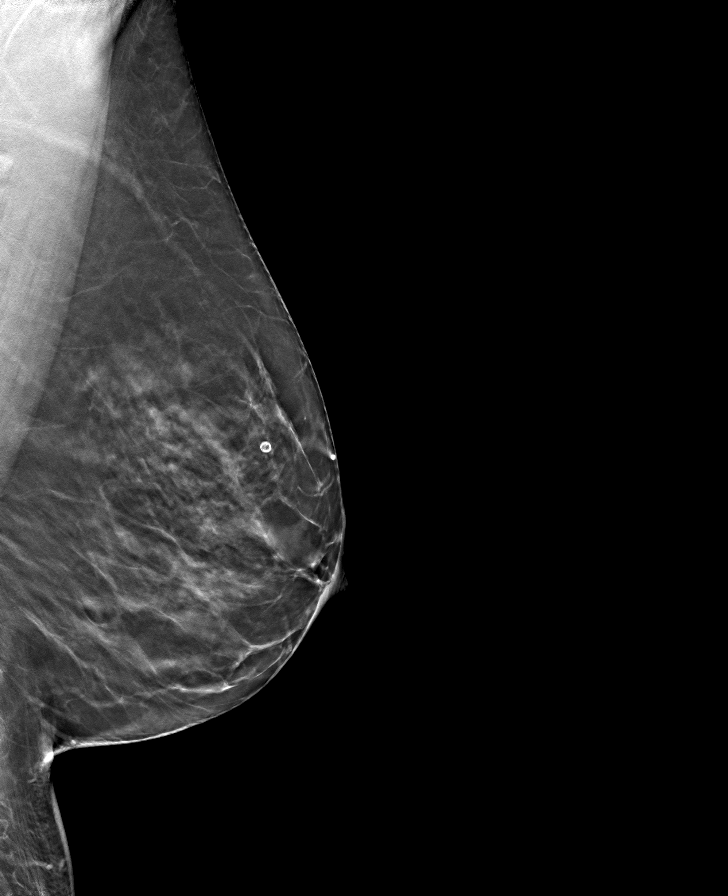

[R CC tomo · tomo slice 31/60.0]
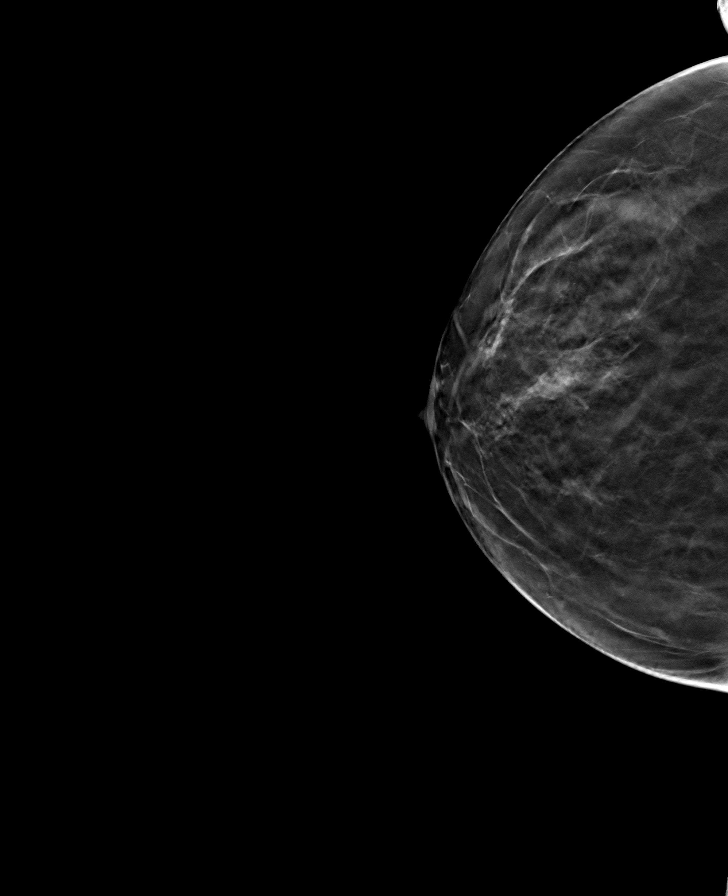

[R MLO tomo · tomo slice 29/58.0]
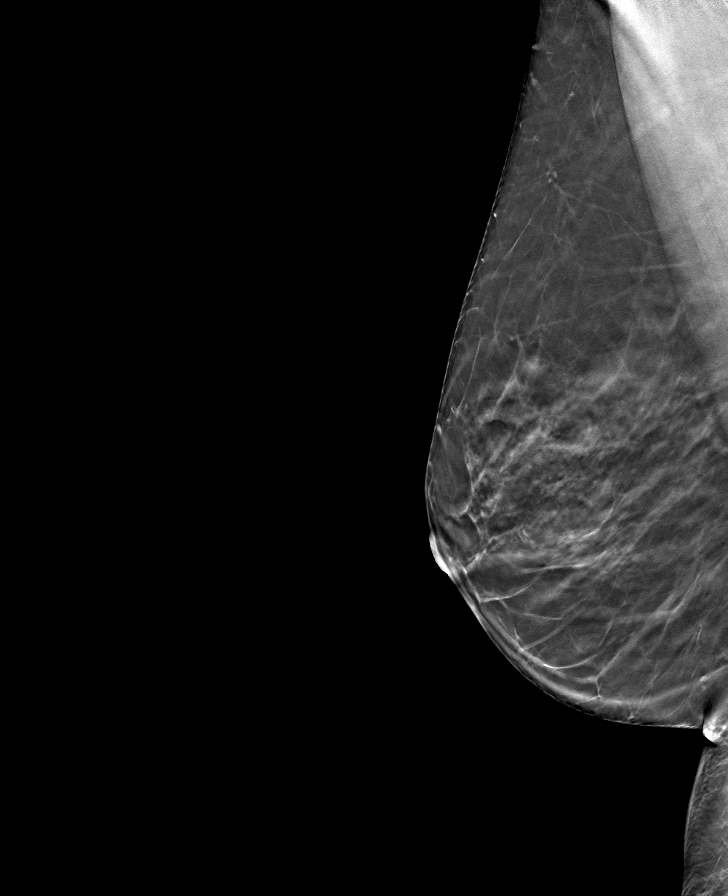

[8 of 24 positions shown; findings below may reference images not displayed]

ACR Breast Density Category c: The breast tissue is heterogeneously
dense, which may obscure small masses.
FINDINGS: There are no findings suspicious for malignancy. Images were
processed with CAD.
IMPRESSION: No mammographic evidence of malignancy. A result letter of this
screening mammogram will be mailed directly to the patient.

RECOMMENDATION:
Screening mammogram in one year. (Code:FT-U-LHB)

BI-RADS CATEGORY  1: Negative.

## 2021-07-16 ENCOUNTER — Ambulatory Visit: Payer: Medicaid Other | Admitting: Physician Assistant

## 2021-07-17 ENCOUNTER — Ambulatory Visit: Payer: Medicaid Other | Admitting: Physician Assistant

## 2021-07-17 ENCOUNTER — Encounter: Payer: Self-pay | Admitting: Physician Assistant

## 2021-07-17 VITALS — BP 131/83 | HR 81 | Temp 98.0°F | Wt 183.0 lb

## 2021-07-17 DIAGNOSIS — Z1239 Encounter for other screening for malignant neoplasm of breast: Secondary | ICD-10-CM

## 2021-07-17 DIAGNOSIS — F17200 Nicotine dependence, unspecified, uncomplicated: Secondary | ICD-10-CM | POA: Insufficient documentation

## 2021-07-17 DIAGNOSIS — I10 Essential (primary) hypertension: Secondary | ICD-10-CM

## 2021-07-17 DIAGNOSIS — E785 Hyperlipidemia, unspecified: Secondary | ICD-10-CM

## 2021-07-17 DIAGNOSIS — R7303 Prediabetes: Secondary | ICD-10-CM

## 2021-07-17 DIAGNOSIS — E669 Obesity, unspecified: Secondary | ICD-10-CM

## 2021-07-17 DIAGNOSIS — Z1211 Encounter for screening for malignant neoplasm of colon: Secondary | ICD-10-CM

## 2021-07-17 MED ORDER — ATORVASTATIN CALCIUM 20 MG PO TABS: ORAL_TABLET | ORAL | 4 refills | Status: DC

## 2021-07-17 MED ORDER — HYDROCHLOROTHIAZIDE 25 MG PO TABS: ORAL_TABLET | ORAL | 4 refills | Status: DC

## 2021-07-17 NOTE — Progress Notes (Signed)
BP 131/83   Pulse 81   Temp 98 F (36.7 C)   Wt 183 lb (83 kg)   LMP 10/26/2010   SpO2 99%   BMI 33.47 kg/m    Subjective:    Patient ID: Sarah Henderson, female    DOB: [birth year], 56 y.o.   MRN: [id number]  HPI: MERRIL ISAKSON is a 56 y.o. female presenting on 07/17/2021 for Hypertension   HPI   Chief Complaint  Patient presents with   Hypertension    Pt was away from clinic for a time; she had insurance/medicaid.  She signed away her insurance so she has none now.    She Uses inhaler/neb maybe once/month  She says she is Doing well.   She has No complaints.     Relevant past medical, surgical, family and social history reviewed and updated as indicated. Interim medical history since our last visit reviewed. Allergies and medications reviewed and updated.   Current Outpatient Medications:    albuterol (PROVENTIL) (2.5 MG/3ML) 0.083% nebulizer solution, Take 3 mLs (2.5 mg total) by nebulization every 2 (two) hours as needed for wheezing or shortness of breath., Disp: 150 mL, Rfl: 1   albuterol (VENTOLIN HFA) 108 (90 Base) MCG/ACT inhaler, Inhale 1-2 puffs into the lungs every 6 (six) hours as needed for wheezing or shortness of breath., Disp: 1 each, Rfl: 0   atorvastatin (LIPITOR) 20 MG tablet, TAKE 1 Tablet BY MOUTH ONCE EVERY DAY, Disp: 30 tablet, Rfl: 0   hydrochlorothiazide (HYDRODIURIL) 25 MG tablet, TAKE 1 Tablet BY MOUTH ONCE EVERY DAY, Disp: 30 tablet, Rfl: 0   Review of Systems  Per HPI unless specifically indicated above     Objective:    BP 131/83   Pulse 81   Temp 98 F (36.7 C)   Wt 183 lb (83 kg)   LMP 10/26/2010   SpO2 99%   BMI 33.47 kg/m   Wt Readings from Last 3 Encounters:  07/17/21 183 lb (83 kg)  02/06/21 179 lb (81.2 kg)  11/17/20 165 lb (74.8 kg)    Physical Exam Vitals reviewed.  Constitutional:      General: She is not in acute distress.    Appearance: She is well-developed. She is not toxic-appearing.  HENT:     Head:  Normocephalic and atraumatic.  Cardiovascular:     Rate and Rhythm: Normal rate and regular rhythm.  Pulmonary:     Effort: Pulmonary effort is normal.     Breath sounds: Normal breath sounds.  Abdominal:     General: Bowel sounds are normal.     Palpations: Abdomen is soft. There is no mass.     Tenderness: There is no abdominal tenderness.  Musculoskeletal:     Cervical back: Neck supple.     Right lower leg: No edema.     Left lower leg: No edema.  Lymphadenopathy:     Cervical: No cervical adenopathy.  Skin:    General: Skin is warm and dry.  Neurological:     Mental Status: She is alert and oriented to person, place, and time.  Psychiatric:        Behavior: Behavior normal.           Assessment & Plan:    Encounter Diagnoses  Name Primary?   Essential hypertension Yes   Hyperlipidemia, unspecified hyperlipidemia type    Prediabetes    Obesity, unspecified classification, unspecified obesity type, unspecified whether serious comorbidity present    Tobacco use  disorder    Screening for colon cancer    Encounter for screening for malignant neoplasm of breast, unspecified screening modality      -refills sent -No labs at this time -pt is referred for screening Mammogram -pt is given FIT test for colon cancer screening -counseled diet and exercise for weight management -counseled on smoking cessation and gave handouts, information on 1-800-quit and local cessation class.  Dis cussed chantx but will try other options first due to cost -pt to follow up 4 months.  She is to contact office sooner prn

## 2021-07-17 NOTE — Patient Instructions (Signed)
Varenicline Tablets What is this medication? VARENICLINE (var e NI kleen) helps you quit smoking. It reduces cravings for nicotine, the addictive substance found in tobacco. It is most effective when used in combination with a stop-smoking program. This medicine may be used for other purposes; ask your health care provider or pharmacist if you have questions. COMMON BRAND NAME(S): Chantix What should I tell my care team before I take this medication? They need to know if you have any of these conditions: Heart disease Frequently drink alcohol Kidney disease Mental health condition On hemodialysis Seizures History of stroke Suicidal thoughts, plans, or attempt by you or a family member An unusual or allergic reaction to varenicline, other medications, foods, dyes, or preservatives Pregnant or trying to get pregnant Breast-feeding How should I use this medication? Take this medication by mouth after eating. Take with a full glass of water. Follow the directions on the prescription label. Take your doses at regular intervals. Do not take your medication more often than directed. There are 3 ways you can use this medication to help you quit smoking; talk to your care team to decide which plan is right for you: 1) you can choose a quit date and start this medication 1 week before the quit date, or, 2) you can start taking this medication before you choose a quit date, and then pick a quit date between day 8 and 35 days of treatment, or, 3) if you are not sure that you are able or willing to quit smoking right away, start taking this medication and slowly decrease the amount you smoke as directed by your care team with the goal of being cigarette-free by week 12 of treatment. Stick to your plan; ask about support groups or other ways to help you remain cigarette-free. If you are motivated to quit smoking and did not succeed during a previous attempt with this medication for reasons other than side  effects, or if you returned to smoking after this treatment, speak with your care team about whether another course of this medication may be right for you. A special MedGuide will be given to you by the pharmacist with each prescription and refill. Be sure to read this information carefully each time. Talk to your care team about the use of this medication in children. This medication is not approved for use in children. Overdosage: If you think you have taken too much of this medicine contact a poison control center or emergency room at once. NOTE: This medicine is only for you. Do not share this medicine with others. What if I miss a dose? If you miss a dose, take it as soon as you can. If it is almost time for your next dose, take only that dose. Do not take double or extra doses. What may interact with this medication? Alcohol Insulin Other medications used to help people quit smoking Theophylline Warfarin This list may not describe all possible interactions. Give your health care provider a list of all the medicines, herbs, non-prescription drugs, or dietary supplements you use. Also tell them if you smoke, drink alcohol, or use illegal drugs. Some items may interact with your medicine. What should I watch for while using this medication? It is okay if you do not succeed at your attempt to quit and have a cigarette. You can still continue your quit attempt and keep using this medication as directed. Just throw away your cigarettes and get back to your quit plan. Talk to your care team   before using other treatments to quit smoking. Using this medication with other treatments to quit smoking may increase the risk for side effects compared to using a treatment alone. This medication may affect your coordination, reaction time, or judgment. Do not drive or operate machinery until you know how this medication affects you. Sit up or stand slowly to reduce the risk of dizzy or fainting  spells. Decrease the number of alcoholic beverages that you drink during treatment with this medication until you know if this medication affects your ability to tolerate alcohol. Some people have experienced increased drunkenness (intoxication), unusual or sometimes aggressive behavior, or no memory of things that have happened (amnesia) during treatment with this medication. You may do unusual sleep behaviors or activities you do not remember the day after taking this medication. Activities include driving, making or eating food, talking on the phone, sexual activity, or sleep walking. Stop taking this medication and call your care team right away if you find out you have done activities like this. Patients and their families should watch out for new or worsening depression or thoughts of suicide. Also watch out for sudden changes in feelings such as feeling anxious, agitated, panicky, irritable, hostile, aggressive, impulsive, severely restless, overly excited and hyperactive, or not being able to sleep. If this happens, call your care team. If you have diabetes, and you quit smoking, the effects of insulin may be increased. You may need to reduce your insulin dose. Check with your care team about how you should adjust your insulin dose. What side effects may I notice from receiving this medication? Side effects that you should report to your care team as soon as possible: Allergic reactions or angioedema--skin rash, itching or hives, swelling of the face, eyes, lips, tongue, arms, or legs, trouble swallowing or breathing Heart attack--pain or tightness in the chest, shoulders, arms, or jaw, nausea, shortness of breath, cold or clammy skin, feeling faint or lightheaded Mood and behavior changes--anxiety, nervousness, confusion, hallucinations, irritability, hostility, thoughts of suicide or self-harm, worsening mood, feelings of depression Redness, blistering, peeling, or loosening of the skin,  including inside the mouth Stroke--sudden numbness or weakness of the face, arm, or leg, trouble speaking, confusion, trouble walking, loss of balance or coordination, dizziness, severe headache, change in vision Seizures Side effects that usually do not require medical attention (report to your care team if they continue or are bothersome): Constipation Drowsiness Gas Nausea Trouble sleeping Upset stomach Vivid dreams or nightmares Vomiting This list may not describe all possible side effects. Call your doctor for medical advice about side effects. You may report side effects to FDA at 1-800-FDA-1088. Where should I keep my medication? Keep out of the reach of children and pets. Store at room temperature between 15 and 30 degrees C (59 and 86 degrees F). Throw away any unused medication after the expiration date. NOTE: This sheet is a summary. It may not cover all possible information. If you have questions about this medicine, talk to your doctor, pharmacist, or health care provider.  2023 Elsevier/Gold Standard (2020-11-28 00:00:00)  

## 2021-09-18 ENCOUNTER — Other Ambulatory Visit: Payer: Self-pay | Admitting: Physician Assistant

## 2021-09-18 DIAGNOSIS — Z1211 Encounter for screening for malignant neoplasm of colon: Secondary | ICD-10-CM

## 2021-09-18 LAB — POC FIT TEST STOOL: Fecal Occult Blood: NEGATIVE

## 2021-09-23 ENCOUNTER — Telehealth: Payer: Self-pay

## 2021-09-23 NOTE — Telephone Encounter (Signed)
Telephoned patient at mobile number. Left voice message with [facility] (scholarship) contact information. 

## 2021-09-25 ENCOUNTER — Other Ambulatory Visit: Payer: Self-pay | Admitting: Physician Assistant

## 2021-09-25 DIAGNOSIS — Z1231 Encounter for screening mammogram for malignant neoplasm of breast: Secondary | ICD-10-CM

## 2021-10-17 ENCOUNTER — Other Ambulatory Visit: Payer: Self-pay | Admitting: Physician Assistant

## 2021-11-12 ENCOUNTER — Ambulatory Visit (HOSPITAL_COMMUNITY): Payer: Medicaid Other

## 2021-11-20 ENCOUNTER — Ambulatory Visit: Payer: Medicaid Other | Admitting: Physician Assistant

## 2021-12-17 ENCOUNTER — Ambulatory Visit (INDEPENDENT_AMBULATORY_CARE_PROVIDER_SITE_OTHER): Payer: Medicaid Other | Admitting: Family Medicine

## 2021-12-17 ENCOUNTER — Ambulatory Visit (HOSPITAL_COMMUNITY)
Admission: RE | Admit: 2021-12-17 | Discharge: 2021-12-17 | Disposition: A | Discharge status: Home / Self Care | Payer: Medicaid Other | Source: Ambulatory Visit | Attending: Family Medicine | Admitting: Family Medicine

## 2021-12-17 VITALS — BP 130/86 | HR 95 | Temp 97.9°F | Ht 62.6 in | Wt 187.4 lb

## 2021-12-17 DIAGNOSIS — I10 Essential (primary) hypertension: Secondary | ICD-10-CM

## 2021-12-17 DIAGNOSIS — M25562 Pain in left knee: Secondary | ICD-10-CM

## 2021-12-17 DIAGNOSIS — E785 Hyperlipidemia, unspecified: Secondary | ICD-10-CM | POA: Diagnosis not present

## 2021-12-17 DIAGNOSIS — G8929 Other chronic pain: Secondary | ICD-10-CM | POA: Insufficient documentation

## 2021-12-17 DIAGNOSIS — R7303 Prediabetes: Secondary | ICD-10-CM | POA: Insufficient documentation

## 2021-12-17 MED ORDER — MELOXICAM 15 MG PO TABS: 15.0000 mg | ORAL_TABLET | Freq: Every day | ORAL | 5 refills | Status: DC | PRN

## 2021-12-17 NOTE — Patient Instructions (Signed)
Medication as prescribed.  Xray at the hospital. We will call with the result.  Take care  Dr. Adriana Simas

## 2021-12-18 DIAGNOSIS — G8929 Other chronic pain: Secondary | ICD-10-CM | POA: Insufficient documentation

## 2021-12-18 NOTE — Assessment & Plan Note (Signed)
X-ray today for further evaluation.  Starting on meloxicam.

## 2021-12-18 NOTE — Assessment & Plan Note (Signed)
Well-controlled.  Continue Lipitor. 

## 2021-12-18 NOTE — Assessment & Plan Note (Signed)
Well-controlled. Continue HCTZ

## 2021-12-18 NOTE — Progress Notes (Signed)
Subjective:  Patient ID: Sarah Henderson, female    DOB: 07/12/1965  Age: 56 y.o. MRN: [id number]  CC: Chief Complaint  Patient presents with   Establish Care   Knee Pain    Patient c/o of left knee pain    HPI:  56 year old female with hypertension, prediabetes, multinodular goiter, hyperlipidemia, tobacco abuse presents to establish care.  Patient's primary concern today is left knee pain.  She states that this has been going on for 3 to 4 months.  Worsening.  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  Worse with activity.  No relieving factors.  Hypertension is well-controlled on HCTZ.  Patient has cut back on her smoking.  Patient's hyperlipidemia is well-controlled on atorvastatin 20 mg daily.  Tolerating without difficulty.  Patient Active Problem List   Diagnosis Date Noted   Chronic pain of left knee 12/18/2021   Prediabetes 12/17/2021   Essential hypertension 07/17/2021   Hyperlipidemia 07/17/2021   Tobacco use disorder 07/17/2021   Multinodular goiter 06/21/2017    Social Hx   Social History   Socioeconomic History   Marital status: Married    Spouse name: Not on file   Number of children: Not on file   Years of education: Not on file   Highest education level: Not on file  Occupational History   Not on file  Tobacco Use   Smoking status: Every Day    Packs/day: 1.00    Years: 40.00    Total pack years: 40.00    Types: Cigarettes   Smokeless tobacco: Never  Vaping Use   Vaping Use: Never used  Substance and Sexual Activity   Alcohol use: No   Drug use: No   Sexual activity: Not on file  Other Topics Concern   Not on file  Social History Narrative   Not on file   Social Determinants of Health   Financial Resource Strain: Not on file  Food Insecurity: Not on file  Transportation Needs: Not on file  Physical Activity: Not on file  Stress: Not on file  Social Connections: Not on  file    Review of Systems Per HPI  Objective:  BP 130/86   Pulse 95   Temp 97.9 F (36.6 C) (Oral)   Ht 5' 2.6" (1.59 m)   Wt 187 lb 6.4 oz (85 kg)   LMP 10/26/2010   SpO2 97%   BMI 33.62 kg/m      12/17/2021    9:59 AM 07/17/2021    9:24 AM 02/06/2021    8:32 AM  BP/Weight  Systolic BP 130 131 133  Diastolic BP 86 83 84  Wt. (Lbs) 187.4 183 179  BMI 33.62 kg/m2 33.47 kg/m2 32.74 kg/m2    Physical Exam Constitutional:      General: She is not in acute distress.    Appearance: Normal appearance. She is obese.  HENT:     Head: Normocephalic and atraumatic.  Cardiovascular:     Rate and Rhythm: Normal rate and regular rhythm.  Pulmonary:     Effort: Pulmonary effort is normal.     Breath sounds: Normal breath sounds. No wheezing, rhonchi or rales.  Musculoskeletal:     Comments: Left knee -no joint line tenderness.  No appreciable swelling.  No appreciable crepitus.  Ligaments intact.  Neurological:     Mental Status: She is alert.  Psychiatric:  Mood and Affect: Mood normal.        Behavior: Behavior normal.     Lab Results  Component Value Date   WBC 5.3 11/17/2020   HGB 14.7 11/17/2020   HCT 43.5 11/17/2020   PLT 213 11/17/2020   GLUCOSE 106 (H) 05/07/2021   CHOL 144 05/07/2021   TRIG 32 05/07/2021   HDL 55 05/07/2021   LDLCALC 83 05/07/2021   ALT 23 05/07/2021   AST 17 05/07/2021   NA 141 05/07/2021   K 4.3 05/07/2021   CL 105 05/07/2021   CREATININE 0.57 05/07/2021   BUN 16 05/07/2021   CO2 29 05/07/2021   TSH 0.615 04/05/2019   HGBA1C 5.7 (H) 05/07/2021     Assessment & Plan:   Problem List Items Addressed This Visit       Cardiovascular and Mediastinum   Essential hypertension    Well-controlled.  Continue HCTZ.        Other   Chronic pain of left knee - Primary    X-ray today for further evaluation.  Starting on meloxicam.      Relevant Medications   meloxicam (MOBIC) 15 MG tablet   Other Relevant Orders   DG Knee  Complete 4 Views Left   Hyperlipidemia    Well-controlled.  Continue Lipitor.       Meds ordered this encounter  Medications   meloxicam (MOBIC) 15 MG tablet    Sig: Take 1 tablet (15 mg total) by mouth daily as needed (Knee pain).    Dispense:  30 tablet    Refill:  5    Follow-up:  Return in about 6 months (around 06/17/2022).  Everlene Other DO [facility]

## 2021-12-23 ENCOUNTER — Other Ambulatory Visit: Payer: Self-pay | Admitting: Family Medicine

## 2021-12-23 DIAGNOSIS — G8929 Other chronic pain: Secondary | ICD-10-CM

## 2022-01-21 ENCOUNTER — Other Ambulatory Visit: Payer: Self-pay

## 2022-01-21 MED ORDER — ALBUTEROL SULFATE HFA 108 (90 BASE) MCG/ACT IN AERS: 1.0000 | INHALATION_SPRAY | Freq: Four times a day (QID) | RESPIRATORY_TRACT | 0 refills | Status: DC | PRN

## 2022-01-21 MED ORDER — ATORVASTATIN CALCIUM 20 MG PO TABS: ORAL_TABLET | ORAL | 0 refills | Status: DC

## 2022-01-21 MED ORDER — HYDROCHLOROTHIAZIDE 25 MG PO TABS: ORAL_TABLET | ORAL | 0 refills | Status: DC

## 2022-01-22 ENCOUNTER — Ambulatory Visit: Payer: Medicaid Other | Admitting: Family Medicine

## 2022-01-22 ENCOUNTER — Ambulatory Visit (INDEPENDENT_AMBULATORY_CARE_PROVIDER_SITE_OTHER): Payer: Medicaid Other | Admitting: Family Medicine

## 2022-01-22 ENCOUNTER — Encounter: Payer: Self-pay | Admitting: Family Medicine

## 2022-01-22 VITALS — BP 135/93 | HR 103 | Temp 98.2°F | Wt 187.2 lb

## 2022-01-22 DIAGNOSIS — J988 Other specified respiratory disorders: Secondary | ICD-10-CM

## 2022-01-22 MED ORDER — ALBUTEROL SULFATE HFA 108 (90 BASE) MCG/ACT IN AERS: 1.0000 | INHALATION_SPRAY | Freq: Four times a day (QID) | RESPIRATORY_TRACT | 0 refills | Status: DC | PRN

## 2022-01-22 MED ORDER — PREDNISONE 10 MG PO TABS: ORAL_TABLET | ORAL | 0 refills | Status: DC

## 2022-01-22 MED ORDER — DOXYCYCLINE HYCLATE 100 MG PO TABS: 100.0000 mg | ORAL_TABLET | Freq: Two times a day (BID) | ORAL | 0 refills | Status: DC

## 2022-01-22 MED ORDER — PROMETHAZINE-DM 6.25-15 MG/5ML PO SYRP: 5.0000 mL | ORAL_SOLUTION | Freq: Four times a day (QID) | ORAL | 0 refills | Status: DC | PRN

## 2022-01-22 NOTE — Assessment & Plan Note (Signed)
Patient with diffuse wheezing on exam.  Given longstanding history of tobacco abuse, I suspect that patient has underlying COPD.  Needs confirmatory pulmonary function test.  Given this, placing on prednisone, doxycycline.  Promethazine DM for cough.  Albuterol as needed for chest tightness or shortness of breath.  Advised to quit smoking.

## 2022-01-22 NOTE — Progress Notes (Signed)
Subjective:  Patient ID: Sarah Henderson, female    DOB: 11/17/1965  Age: 57 y.o. MRN: [id number]  CC: Chief Complaint  Patient presents with   Cough    Pt arrives with cough, runny/stuffy nose, fatigue and having hard time catching breath. Pt has history of bronchitis.     HPI:  57 year old female who was a current smoker presents with respiratory symptoms.  Has been sick for the past 2 days.  Has cough, headache, sinus pressure and congestion.  Cough is productive.  Has been wheezing.  Has used albuterol recently without resolution.  States that she is having some chest tightness and shortness of breath.  Also reports fatigue.  No documented fever.  No other associated symptoms.  No other complaints.  Patient Active Problem List   Diagnosis Date Noted   Respiratory infection 01/22/2022   Chronic pain of left knee 12/18/2021   Prediabetes 12/17/2021   Essential hypertension 07/17/2021   Hyperlipidemia 07/17/2021   Tobacco use disorder 07/17/2021   Multinodular goiter 06/21/2017    Social Hx   Social History   Socioeconomic History   Marital status: Married    Spouse name: Not on file   Number of children: Not on file   Years of education: Not on file   Highest education level: Not on file  Occupational History   Not on file  Tobacco Use   Smoking status: Every Day    Packs/day: 1.00    Years: 40.00    Total pack years: 40.00    Types: Cigarettes   Smokeless tobacco: Never  Vaping Use   Vaping Use: Never used  Substance and Sexual Activity   Alcohol use: No   Drug use: No   Sexual activity: Not on file  Other Topics Concern   Not on file  Social History Narrative   Not on file   Social Determinants of Health   Financial Resource Strain: Not on file  Food Insecurity: Not on file  Transportation Needs: Not on file  Physical Activity: Not on file  Stress: Not on file  Social Connections: Not on file    Review of Systems Per HPI  Objective:  BP (!)  135/93   Pulse (!) 103   Temp 98.2 F (36.8 C)   Wt 187 lb 3.2 oz (84.9 kg)   LMP 10/26/2010   SpO2 98%   BMI 33.59 kg/m      01/22/2022    1:58 PM 12/17/2021    9:59 AM 07/17/2021    9:24 AM  BP/Weight  Systolic BP 322 025 427  Diastolic BP 93 86 83  Wt. (Lbs) 187.2 187.4 183  BMI 33.59 kg/m2 33.62 kg/m2 33.47 kg/m2    Physical Exam Constitutional:      General: She is not in acute distress.    Appearance: Normal appearance.  HENT:     Head: Normocephalic and atraumatic.     Nose: Congestion present.     Mouth/Throat:     Pharynx: Oropharynx is clear.  Eyes:     General:        Right eye: No discharge.        Left eye: No discharge.     Conjunctiva/sclera: Conjunctivae normal.  Cardiovascular:     Rate and Rhythm: Regular rhythm. Tachycardia present.  Pulmonary:     Effort: Pulmonary effort is normal.     Comments: Diffuse expiratory wheezing. Neurological:     Mental Status: She is alert.  Lab Results  Component Value Date   WBC 5.3 11/17/2020   HGB 14.7 11/17/2020   HCT 43.5 11/17/2020   PLT 213 11/17/2020   GLUCOSE 106 (H) 05/07/2021   CHOL 144 05/07/2021   TRIG 32 05/07/2021   HDL 55 05/07/2021   LDLCALC 83 05/07/2021   ALT 23 05/07/2021   AST 17 05/07/2021   NA 141 05/07/2021   K 4.3 05/07/2021   CL 105 05/07/2021   CREATININE 0.57 05/07/2021   BUN 16 05/07/2021   CO2 29 05/07/2021   TSH 0.615 04/05/2019   HGBA1C 5.7 (H) 05/07/2021     Assessment & Plan:   Problem List Items Addressed This Visit       Respiratory   Respiratory infection - Primary    Patient with diffuse wheezing on exam.  Given longstanding history of tobacco abuse, I suspect that patient has underlying COPD.  Needs confirmatory pulmonary function test.  Given this, placing on prednisone, doxycycline.  Promethazine DM for cough.  Albuterol as needed for chest tightness or shortness of breath.  Advised to quit smoking.       Meds ordered this encounter   Medications   predniSONE (DELTASONE) 10 MG tablet    Sig: 50 mg daily x 2 days, then 40 mg daily x 2 days, then 30 mg daily x 2 days, then 20 mg daily x 2 days, then 10 mg daily x 2 days.    Dispense:  30 tablet    Refill:  0   doxycycline (VIBRA-TABS) 100 MG tablet    Sig: Take 1 tablet (100 mg total) by mouth 2 (two) times daily.    Dispense:  14 tablet    Refill:  0   promethazine-dextromethorphan (PROMETHAZINE-DM) 6.25-15 MG/5ML syrup    Sig: Take 5 mLs by mouth 4 (four) times daily as needed for cough.    Dispense:  118 mL    Refill:  0   albuterol (VENTOLIN HFA) 108 (90 Base) MCG/ACT inhaler    Sig: Inhale 1-2 puffs into the lungs every 6 (six) hours as needed for wheezing or shortness of breath.    Dispense:  1 each    Refill:  [place]

## 2022-01-22 NOTE — Patient Instructions (Signed)
Try and quit smoking.  Medications as directed.  Take care  Dr. Lacinda Axon

## 2022-01-23 ENCOUNTER — Ambulatory Visit (HOSPITAL_COMMUNITY): Payer: Medicaid Other

## 2022-01-28 ENCOUNTER — Ambulatory Visit (HOSPITAL_COMMUNITY)
Admission: RE | Admit: 2022-01-28 | Discharge: 2022-01-28 | Disposition: A | Discharge status: Home / Self Care | Payer: Medicaid Other | Source: Ambulatory Visit | Attending: Family Medicine | Admitting: Family Medicine

## 2022-01-28 DIAGNOSIS — M25562 Pain in left knee: Secondary | ICD-10-CM | POA: Insufficient documentation

## 2022-01-28 DIAGNOSIS — G8929 Other chronic pain: Secondary | ICD-10-CM | POA: Insufficient documentation

## 2022-02-19 ENCOUNTER — Other Ambulatory Visit: Payer: Self-pay | Admitting: Family Medicine

## 2022-03-10 ENCOUNTER — Other Ambulatory Visit: Payer: Self-pay | Admitting: Family Medicine

## 2022-05-12 ENCOUNTER — Ambulatory Visit (INDEPENDENT_AMBULATORY_CARE_PROVIDER_SITE_OTHER): Payer: Medicaid Other | Admitting: Family Medicine

## 2022-05-12 VITALS — BP 130/86 | HR 95 | Temp 98.0°F | Ht 62.6 in | Wt 195.0 lb

## 2022-05-12 DIAGNOSIS — R051 Acute cough: Secondary | ICD-10-CM

## 2022-05-12 DIAGNOSIS — R059 Cough, unspecified: Secondary | ICD-10-CM | POA: Insufficient documentation

## 2022-05-12 MED ORDER — PREDNISONE 50 MG PO TABS: 50.0000 mg | ORAL_TABLET | Freq: Every day | ORAL | 0 refills | Status: AC

## 2022-05-12 MED ORDER — HYDROCODONE BIT-HOMATROP MBR 5-1.5 MG/5ML PO SOLN: 5.0000 mL | Freq: Three times a day (TID) | ORAL | 0 refills | Status: DC | PRN

## 2022-05-12 NOTE — Progress Notes (Signed)
Subjective:  Patient ID: Sarah Henderson, female    DOB: [birth year]  Age: 57 y.o. MRN: [id number]  CC: Chief Complaint  Patient presents with   Cough    Most non productive caused sore throat   Fatigue    No sob or wheezing    Nasal Congestion    HPI:  57 year old female presents for evaluation of the above.  Started yesterday. She reports congestion, sore throat and troublesome cough. No fever, chills. No SOB. Cough is worse at night. No relieving factors. No other complaints.   Patient Active Problem List   Diagnosis Date Noted   Cough 05/12/2022   Chronic pain of left knee 12/18/2021   Prediabetes 12/17/2021   Essential hypertension 07/17/2021   Hyperlipidemia 07/17/2021   Tobacco use disorder 07/17/2021   Multinodular goiter 06/21/2017    Social Hx   Social History   Socioeconomic History   Marital status: Married    Spouse name: Not on file   Number of children: Not on file   Years of education: Not on file   Highest education level: 9th grade  Occupational History   Not on file  Tobacco Use   Smoking status: Every Day    Packs/day: 1.00    Years: 40.00    Additional pack years: 0.00    Total pack years: 40.00    Types: Cigarettes   Smokeless tobacco: Never  Vaping Use   Vaping Use: Never used  Substance and Sexual Activity   Alcohol use: No   Drug use: No   Sexual activity: Not on file  Other Topics Concern   Not on file  Social History Narrative   Not on file   Social Determinants of Health   Financial Resource Strain: Low Risk  (05/12/2022)   Overall Financial Resource Strain (CARDIA)    Difficulty of Paying Living Expenses: Not hard at all  Food Insecurity: No Food Insecurity (05/12/2022)   Hunger Vital Sign    Worried About Running Out of Food in the Last Year: Never true    Ran Out of Food in the Last Year: Never true  Transportation Needs: No Transportation Needs (05/12/2022)   PRAPARE - Administrator, Civil Service  (Medical): No    Lack of Transportation (Non-Medical): No  Physical Activity: Sufficiently Active (05/12/2022)   Exercise Vital Sign    Days of Exercise per Week: 7 days    Minutes of Exercise per Session: 30 min  Stress: No Stress Concern Present (05/12/2022)   [employer] of Occupational Health - Occupational Stress Questionnaire    Feeling of Stress : Not at all  Social Connections: Unknown (05/12/2022)   Social Connection and Isolation Panel [NHANES]    Frequency of Communication with Friends and Family: Patient declined    Frequency of Social Gatherings with Friends and Family: Patient declined    Attends Religious Services: Patient declined    Database administrator or Organizations: No    Attends Engineer, structural: Not on file    Marital Status: Married    Review of Systems Per HPI  Objective:  BP 130/86   Pulse 95   Temp 98 F (36.7 C)   Ht 5' 2.6" (1.59 m)   Wt 195 lb (88.5 kg)   LMP 10/26/2010   SpO2 98%   BMI 34.99 kg/m      05/12/2022    3:29 PM 01/22/2022    1:58 PM 12/17/2021  9:59 AM  BP/Weight  Systolic BP 130 135 130  Diastolic BP 86 93 86  Wt. (Lbs) 195 187.2 187.4  BMI 34.99 kg/m2 33.59 kg/m2 33.62 kg/m2    Physical Exam Vitals and nursing note reviewed.  Constitutional:      General: She is not in acute distress.    Appearance: Normal appearance.  HENT:     Head: Normocephalic and atraumatic.  Eyes:     General:        Right eye: No discharge.        Left eye: No discharge.     Conjunctiva/sclera: Conjunctivae normal.  Cardiovascular:     Rate and Rhythm: Normal rate and regular rhythm.  Pulmonary:     Effort: Pulmonary effort is normal.     Breath sounds: Normal breath sounds. No wheezing, rhonchi or rales.  Neurological:     Mental Status: She is alert.  Psychiatric:        Mood and Affect: Mood normal.        Behavior: Behavior normal.     Lab Results  Component Value Date   WBC 5.3 11/17/2020   HGB 14.7  11/17/2020   HCT 43.5 11/17/2020   PLT 213 11/17/2020   GLUCOSE 106 (H) 05/07/2021   CHOL 144 05/07/2021   TRIG 32 05/07/2021   HDL 55 05/07/2021   LDLCALC 83 05/07/2021   ALT 23 05/07/2021   AST 17 05/07/2021   NA 141 05/07/2021   K 4.3 05/07/2021   CL 105 05/07/2021   CREATININE 0.57 05/07/2021   BUN 16 05/07/2021   CO2 29 05/07/2021   TSH 0.615 04/05/2019   HGBA1C 5.7 (H) 05/07/2021     Assessment & Plan:   Problem List Items Addressed This Visit       Other   Cough - Primary    Given suspected underlying COPD, treating with prednisone and Hycodan. No indication for antibiotics at this time.       Meds ordered this encounter  Medications   predniSONE (DELTASONE) 50 MG tablet    Sig: Take 1 tablet (50 mg total) by mouth daily for 5 days.    Dispense:  5 tablet    Refill:  0   HYDROcodone bit-homatropine (HYCODAN) 5-1.5 MG/5ML syrup    Sig: Take 5 mLs by mouth every 8 (eight) hours as needed for cough.    Dispense:  120 mL    Refill:  0    Follow-up:  Return if symptoms worsen or fail to improve.  Everlene Other DO [facility]

## 2022-05-12 NOTE — Assessment & Plan Note (Signed)
Given suspected underlying COPD, treating with prednisone and Hycodan. No indication for antibiotics at this time.

## 2022-05-12 NOTE — Patient Instructions (Signed)
Medication as prescribed. ° °Call with concerns. ° °Take care ° °Dr Therese Rocco °

## 2022-06-17 ENCOUNTER — Ambulatory Visit: Payer: Medicaid Other | Admitting: Family Medicine

## 2022-06-17 VITALS — BP 121/79 | HR 92 | Temp 98.4°F | Ht 62.6 in | Wt 194.0 lb

## 2022-06-17 DIAGNOSIS — E042 Nontoxic multinodular goiter: Secondary | ICD-10-CM | POA: Diagnosis not present

## 2022-06-17 DIAGNOSIS — R7303 Prediabetes: Secondary | ICD-10-CM | POA: Diagnosis not present

## 2022-06-17 DIAGNOSIS — R5383 Other fatigue: Secondary | ICD-10-CM

## 2022-06-17 DIAGNOSIS — E785 Hyperlipidemia, unspecified: Secondary | ICD-10-CM

## 2022-06-17 DIAGNOSIS — I10 Essential (primary) hypertension: Secondary | ICD-10-CM

## 2022-06-17 DIAGNOSIS — Z1211 Encounter for screening for malignant neoplasm of colon: Secondary | ICD-10-CM

## 2022-06-17 DIAGNOSIS — Z130 Encounter for screening for diseases of the blood and blood-forming organs and certain disorders involving the immune mechanism: Secondary | ICD-10-CM

## 2022-06-17 NOTE — Patient Instructions (Signed)
Labs today.  Call [phone] to schedule mammogram.  Cologuard ordered.  It will come to your house.  Consider CT lung cancer screening.  Continue to work on smoking cessation.

## 2022-06-18 DIAGNOSIS — R5383 Other fatigue: Secondary | ICD-10-CM | POA: Insufficient documentation

## 2022-06-18 LAB — CMP14+EGFR
ALT: 29 IU/L (ref 0–32)
AST: 18 IU/L (ref 0–40)
Albumin/Globulin Ratio: 1.7 (ref 1.2–2.2)
Albumin: 4.5 g/dL (ref 3.8–4.9)
Alkaline Phosphatase: 90 IU/L (ref 44–121)
BUN/Creatinine Ratio: 18 (ref 9–23)
BUN: 11 mg/dL (ref 6–24)
Bilirubin Total: 0.5 mg/dL (ref 0.0–1.2)
CO2: 26 mmol/L (ref 20–29)
Calcium: 10 mg/dL (ref 8.7–10.2)
Chloride: 100 mmol/L (ref 96–106)
Creatinine, Ser: 0.61 mg/dL (ref 0.57–1.00)
Globulin, Total: 2.6 g/dL (ref 1.5–4.5)
Glucose: 102 mg/dL — ABNORMAL HIGH (ref 70–99)
Potassium: 4.6 mmol/L (ref 3.5–5.2)
Sodium: 142 mmol/L (ref 134–144)
Total Protein: 7.1 g/dL (ref 6.0–8.5)
eGFR: 105 mL/min/{1.73_m2} (ref >59)

## 2022-06-18 LAB — LIPID PANEL
Chol/HDL Ratio: 3.2 ratio (ref 0.0–4.4)
Cholesterol, Total: 164 mg/dL (ref 100–199)
HDL: 52 mg/dL (ref >39)
LDL Chol Calc (NIH): 100 mg/dL — ABNORMAL HIGH (ref 0–99)
Triglycerides: 63 mg/dL (ref 0–149)
VLDL Cholesterol Cal: 12 mg/dL (ref 5–40)

## 2022-06-18 LAB — CBC
Hematocrit: 43.1 % (ref 34.0–46.6)
Hemoglobin: 14.6 g/dL (ref 11.1–15.9)
MCH: 30.8 pg (ref 26.6–33.0)
MCHC: 33.9 g/dL (ref 31.5–35.7)
MCV: 91 fL (ref 79–97)
Platelets: 259 10*3/uL (ref 150–450)
RBC: 4.74 x10E6/uL (ref 3.77–5.28)
RDW: 13.1 % (ref 11.7–15.4)
WBC: 7.2 10*3/uL (ref 3.4–10.8)

## 2022-06-18 LAB — HEMOGLOBIN A1C
Est. average glucose Bld gHb Est-mCnc: 140 mg/dL
Hgb A1c MFr Bld: 6.5 % — ABNORMAL HIGH (ref 4.8–5.6)

## 2022-06-18 LAB — TSH: TSH: 0.844 u[IU]/mL (ref 0.450–4.500)

## 2022-06-18 NOTE — Assessment & Plan Note (Signed)
Well-controlled. Continue HCTZ

## 2022-06-18 NOTE — Progress Notes (Signed)
Subjective:  Patient ID: Sarah Henderson, female    DOB: 1965-10-31  Age: 57 y.o. MRN: [id number]  CC: Chief Complaint  Patient presents with   6 month follow up     HPI:  57 year old female with hypertension, multinodular goiter, tobacco abuse, prediabetes, hyperlipidemia presents for follow-up.  Patient reports that she has had ongoing fatigue.  She reports that this has been going on for the past few weeks.  She reports that at night she feels very tired and fatigued and goes to bed early.  Denies any daytime fatigue.  No reports of chest pain or shortness of breath.  No night sweats.  Hypertension is stable on hydrochlorothiazide.  Patient compliant with atorvastatin.  No reported side effects.  Patient is working on cutting back on smoking.  She is overdue for multiple preventative healthcare items.  Advised to call and schedule mammogram.  We discussed lung cancer screening today.  She will think about this.  She is amenable to Cologuard.  Will order.  Patient Active Problem List   Diagnosis Date Noted   Fatigue 06/18/2022   Chronic pain of left knee 12/18/2021   Prediabetes 12/17/2021   Essential hypertension 07/17/2021   Hyperlipidemia 07/17/2021   Tobacco use disorder 07/17/2021   Multinodular goiter 06/21/2017    Social Hx   Social History   Socioeconomic History   Marital status: Married    Spouse name: Not on file   Number of children: Not on file   Years of education: Not on file   Highest education level: 9th grade  Occupational History   Not on file  Tobacco Use   Smoking status: Every Day    Packs/day: 1.00    Years: 40.00    Additional pack years: 0.00    Total pack years: 40.00    Types: Cigarettes   Smokeless tobacco: Never  Vaping Use   Vaping Use: Never used  Substance and Sexual Activity   Alcohol use: No   Drug use: No   Sexual activity: Not on file  Other Topics Concern   Not on file  Social History Narrative   Not on file    Social Determinants of Health   Financial Resource Strain: Low Risk  (05/12/2022)   Overall Financial Resource Strain (CARDIA)    Difficulty of Paying Living Expenses: Not hard at all  Food Insecurity: No Food Insecurity (05/12/2022)   Hunger Vital Sign    Worried About Running Out of Food in the Last Year: Never true    Ran Out of Food in the Last Year: Never true  Transportation Needs: No Transportation Needs (05/12/2022)   PRAPARE - Administrator, Civil Service (Medical): No    Lack of Transportation (Non-Medical): No  Physical Activity: Sufficiently Active (05/12/2022)   Exercise Vital Sign    Days of Exercise per Week: 7 days    Minutes of Exercise per Session: 30 min  Stress: No Stress Concern Present (05/12/2022)   [employer] of Occupational Health - Occupational Stress Questionnaire    Feeling of Stress : Not at all  Social Connections: Unknown (05/12/2022)   Social Connection and Isolation Panel [NHANES]    Frequency of Communication with Friends and Family: Patient declined    Frequency of Social Gatherings with Friends and Family: Patient declined    Attends Religious Services: Patient declined    Database administrator or Organizations: No    Attends Banker Meetings: Not on file  Marital Status: Married    Review of Systems Per HPI  Objective:  BP 121/79   Pulse 92   Temp 98.4 F (36.9 C)   Ht 5' 2.6" (1.59 m)   Wt 194 lb (88 kg)   LMP 10/26/2010   SpO2 99%   BMI 34.81 kg/m      06/17/2022    9:46 AM 05/12/2022    3:29 PM 01/22/2022    1:58 PM  BP/Weight  Systolic BP 121 130 135  Diastolic BP 79 86 93  Wt. (Lbs) 194 195 187.2  BMI 34.81 kg/m2 34.99 kg/m2 33.59 kg/m2    Physical Exam Vitals and nursing note reviewed.  Constitutional:      General: She is not in acute distress.    Appearance: Normal appearance.  HENT:     Head: Normocephalic and atraumatic.  Eyes:     General:        Right eye: No discharge.         Left eye: No discharge.     Conjunctiva/sclera: Conjunctivae normal.  Cardiovascular:     Rate and Rhythm: Normal rate and regular rhythm.  Pulmonary:     Effort: Pulmonary effort is normal.     Breath sounds: Normal breath sounds. No wheezing, rhonchi or rales.  Neurological:     Mental Status: She is alert.  Psychiatric:        Mood and Affect: Mood normal.        Behavior: Behavior normal.     Lab Results  Component Value Date   WBC 7.2 06/17/2022   HGB 14.6 06/17/2022   HCT 43.1 06/17/2022   PLT 259 06/17/2022   GLUCOSE 102 (H) 06/17/2022   CHOL 164 06/17/2022   TRIG 63 06/17/2022   HDL 52 06/17/2022   LDLCALC 100 (H) 06/17/2022   ALT 29 06/17/2022   AST 18 06/17/2022   NA 142 06/17/2022   K 4.6 06/17/2022   CL 100 06/17/2022   CREATININE 0.61 06/17/2022   BUN 11 06/17/2022   CO2 26 06/17/2022   TSH 0.844 06/17/2022   HGBA1C 6.5 (H) 06/17/2022     Assessment & Plan:   Problem List Items Addressed This Visit       Cardiovascular and Mediastinum   Essential hypertension - Primary    Well-controlled.  Continue HCTZ.      Relevant Orders   CMP14+EGFR (Completed)     Endocrine   Multinodular goiter    Patient had prior biopsy.  Advised that she should consider getting a repeat ultrasound in the near future.  TSH was obtained and was normal.      Relevant Orders   TSH (Completed)     Other   Prediabetes   Relevant Orders   Hemoglobin A1c (Completed)   Fatigue    Overall well-appearing.  Labs were obtained for further evaluation and were essentially unremarkable regarding fatigue.  LDL was mildly elevated.  Patient's A1c 6.5.      Hyperlipidemia    Lipid panel was obtained and LDL was 100.  Will discuss increasing atorvastatin.      Relevant Orders   Lipid panel (Completed)   Other Visit Diagnoses     Screening for deficiency anemia       Relevant Orders   CBC (Completed)   Colon cancer screening       Relevant Orders   Cologuard        Follow-up:  Return in about 6 months (around 12/18/2022).  Dorie Rank  Adriana Simas DO [facility]

## 2022-06-18 NOTE — Assessment & Plan Note (Signed)
Lipid panel was obtained and LDL was 100.  Will discuss increasing atorvastatin.

## 2022-06-18 NOTE — Assessment & Plan Note (Signed)
Overall well-appearing.  Labs were obtained for further evaluation and were essentially unremarkable regarding fatigue.  LDL was mildly elevated.  Patient's A1c 6.5.

## 2022-06-18 NOTE — Assessment & Plan Note (Signed)
Patient had prior biopsy.  Advised that she should consider getting a repeat ultrasound in the near future.  TSH was obtained and was normal.

## 2022-06-22 ENCOUNTER — Telehealth: Payer: Self-pay

## 2022-06-22 ENCOUNTER — Other Ambulatory Visit: Payer: Self-pay | Admitting: Family Medicine

## 2022-06-22 MED ORDER — METFORMIN HCL 500 MG PO TABS
500.0000 mg | ORAL_TABLET | Freq: Two times a day (BID) | ORAL | 3 refills | Status: DC
Start: 2022-06-22 — End: 2023-08-02

## 2022-06-22 MED ORDER — ATORVASTATIN CALCIUM 40 MG PO TABS: 40.0000 mg | ORAL_TABLET | Freq: Every day | ORAL | 3 refills | Status: DC

## 2022-06-22 NOTE — Telephone Encounter (Signed)
Patient would like to go ahead and have metformin and new dose of cholesterol medicine sent to the pharmacy, [facility] in [place],

## 2022-07-11 ENCOUNTER — Other Ambulatory Visit: Payer: Self-pay | Admitting: Family Medicine

## 2022-08-10 LAB — COLOGUARD: COLOGUARD: NEGATIVE

## 2022-10-01 ENCOUNTER — Ambulatory Visit (INDEPENDENT_AMBULATORY_CARE_PROVIDER_SITE_OTHER): Payer: Medicaid Other | Admitting: Family Medicine

## 2022-10-01 ENCOUNTER — Encounter: Payer: Self-pay | Admitting: Family Medicine

## 2022-10-01 VITALS — BP 122/86 | HR 114 | Temp 98.8°F | Wt 178.4 lb

## 2022-10-01 DIAGNOSIS — R051 Acute cough: Secondary | ICD-10-CM

## 2022-10-01 DIAGNOSIS — J3489 Other specified disorders of nose and nasal sinuses: Secondary | ICD-10-CM | POA: Diagnosis not present

## 2022-10-01 MED ORDER — CETIRIZINE HCL 10 MG PO TABS
10.0000 mg | ORAL_TABLET | Freq: Every day | ORAL | 1 refills | Status: DC
Start: 2022-10-01 — End: 2022-11-24

## 2022-10-01 MED ORDER — FLUTICASONE PROPIONATE 50 MCG/ACT NA SUSP: 2.0000 | Freq: Every day | NASAL | 6 refills | Status: AC

## 2022-10-01 MED ORDER — PREDNISONE 50 MG PO TABS: 50.0000 mg | ORAL_TABLET | Freq: Every day | ORAL | 0 refills | Status: AC

## 2022-10-01 NOTE — Patient Instructions (Signed)
Medications as prescribed.  Awaiting COVID results.  Take care  Dr. Adriana Simas

## 2022-10-02 DIAGNOSIS — J3489 Other specified disorders of nose and nasal sinuses: Secondary | ICD-10-CM | POA: Insufficient documentation

## 2022-10-02 LAB — NOVEL CORONAVIRUS, NAA: SARS-CoV-2, NAA: NOT DETECTED

## 2022-10-02 LAB — SPECIMEN STATUS REPORT

## 2022-10-02 NOTE — Assessment & Plan Note (Signed)
Possible COVID-19.  Awaiting test results.  Patient has known allergies.  Placing on prednisone, Zyrtec, and Flonase.

## 2022-10-02 NOTE — Progress Notes (Signed)
Subjective:  Patient ID: Sarah Henderson, female    DOB: November 25, 1965  Age: 57 y.o. MRN: [id number]  CC: Respiratory symptoms   HPI:  57 year old female presents for evaluation of respiratory symptoms.  Symptoms started yesterday morning.  She has had sinus pressure and congestion and cough.  Associated headache.  No documented fever.  No relieving factors.  Needs note for work.  Patient believes that her symptoms are due to allergies.  Patient Active Problem List   Diagnosis Date Noted   Sinus pressure 10/02/2022   Chronic pain of left knee 12/18/2021   Prediabetes 12/17/2021   Essential hypertension 07/17/2021   Hyperlipidemia 07/17/2021   Tobacco use disorder 07/17/2021   Multinodular goiter 06/21/2017    Social Hx   Social History   Socioeconomic History   Marital status: Married    Spouse name: Not on file   Number of children: Not on file   Years of education: Not on file   Highest education level: 9th grade  Occupational History   Not on file  Tobacco Use   Smoking status: Every Day    Current packs/day: 1.00    Average packs/day: 1 pack/day for 40.0 years (40.0 ttl pk-yrs)    Types: Cigarettes   Smokeless tobacco: Never  Vaping Use   Vaping status: Never Used  Substance and Sexual Activity   Alcohol use: No   Drug use: No   Sexual activity: Not on file  Other Topics Concern   Not on file  Social History Narrative   Not on file   Social Determinants of Health   Financial Resource Strain: Low Risk  (05/12/2022)   Overall Financial Resource Strain (CARDIA)    Difficulty of Paying Living Expenses: Not hard at all  Food Insecurity: No Food Insecurity (05/12/2022)   Hunger Vital Sign    Worried About Running Out of Food in the Last Year: Never true    Ran Out of Food in the Last Year: Never true  Transportation Needs: No Transportation Needs (05/12/2022)   PRAPARE - Administrator, Civil Service (Medical): No    Lack of Transportation  (Non-Medical): No  Physical Activity: Sufficiently Active (05/12/2022)   Exercise Vital Sign    Days of Exercise per Week: 7 days    Minutes of Exercise per Session: 30 min  Stress: No Stress Concern Present (05/12/2022)   [employer] of Occupational Health - Occupational Stress Questionnaire    Feeling of Stress : Not at all  Social Connections: Unknown (05/12/2022)   Social Connection and Isolation Panel [NHANES]    Frequency of Communication with Friends and Family: Patient declined    Frequency of Social Gatherings with Friends and Family: Patient declined    Attends Religious Services: Patient declined    Database administrator or Organizations: No    Attends Engineer, structural: Not on file    Marital Status: Married    Review of Systems Per HPI  Objective:  BP 122/86   Pulse (!) 114   Temp 98.8 F (37.1 C) (Oral)   Wt 178 lb 6.4 oz (80.9 kg)   LMP 10/26/2010   SpO2 97%   BMI 32.01 kg/m      10/01/2022    4:01 PM 06/17/2022    9:46 AM 05/12/2022    3:29 PM  BP/Weight  Systolic BP 122 121 130  Diastolic BP 86 79 86  Wt. (Lbs) 178.4 194 195  BMI 32.01 kg/m2 34.81 kg/m2  34.99 kg/m2    Physical Exam Vitals reviewed.  Constitutional:      General: She is not in acute distress.    Appearance: Normal appearance.  HENT:     Head: Normocephalic and atraumatic.     Comments: Frontal sinus tenderness to palpation.    Mouth/Throat:     Pharynx: Oropharynx is clear.  Cardiovascular:     Rate and Rhythm: Regular rhythm. Tachycardia present.  Pulmonary:     Effort: Pulmonary effort is normal.     Breath sounds: Normal breath sounds. No wheezing or rales.  Neurological:     Mental Status: She is alert.     Lab Results  Component Value Date   WBC 7.2 06/17/2022   HGB 14.6 06/17/2022   HCT 43.1 06/17/2022   PLT 259 06/17/2022   GLUCOSE 102 (H) 06/17/2022   CHOL 164 06/17/2022   TRIG 63 06/17/2022   HDL 52 06/17/2022   LDLCALC 100 (H) 06/17/2022    ALT 29 06/17/2022   AST 18 06/17/2022   NA 142 06/17/2022   K 4.6 06/17/2022   CL 100 06/17/2022   CREATININE 0.61 06/17/2022   BUN 11 06/17/2022   CO2 26 06/17/2022   TSH 0.844 06/17/2022   HGBA1C 6.5 (H) 06/17/2022     Assessment & Plan:   Problem List Items Addressed This Visit       Other   Sinus pressure - Primary    Possible COVID-19.  Awaiting test results.  Patient has known allergies.  Placing on prednisone, Zyrtec, and Flonase.      Other Visit Diagnoses     Acute cough       Relevant Orders   Novel Coronavirus, NAA ([facility])       Meds ordered this encounter  Medications   predniSONE (DELTASONE) 50 MG tablet    Sig: Take 1 tablet (50 mg total) by mouth daily for 5 days.    Dispense:  5 tablet    Refill:  0   cetirizine (ZYRTEC ALLERGY) 10 MG tablet    Sig: Take 1 tablet (10 mg total) by mouth daily.    Dispense:  30 tablet    Refill:  1   fluticasone (FLONASE) 50 MCG/ACT nasal spray    Sig: Place 2 sprays into both nostrils daily.    Dispense:  16 g    Refill:  6    Follow-up:  Return if symptoms worsen or fail to improve.  Everlene Other DO [facility]

## 2022-10-07 ENCOUNTER — Other Ambulatory Visit: Payer: Self-pay | Admitting: Family Medicine

## 2022-11-24 ENCOUNTER — Other Ambulatory Visit: Payer: Self-pay | Admitting: Family Medicine

## 2022-12-22 ENCOUNTER — Other Ambulatory Visit: Payer: Self-pay | Admitting: Family Medicine

## 2022-12-23 ENCOUNTER — Ambulatory Visit: Payer: Medicaid Other | Admitting: Family Medicine

## 2023-01-04 ENCOUNTER — Other Ambulatory Visit: Payer: Self-pay | Admitting: Family Medicine

## 2023-01-18 ENCOUNTER — Other Ambulatory Visit: Payer: Self-pay | Admitting: Family Medicine

## 2023-01-22 ENCOUNTER — Ambulatory Visit (INDEPENDENT_AMBULATORY_CARE_PROVIDER_SITE_OTHER): Payer: Medicaid Other | Admitting: Family Medicine

## 2023-01-22 VITALS — BP 120/83 | HR 90 | Temp 98.2°F | Ht 62.6 in | Wt 194.6 lb

## 2023-01-22 DIAGNOSIS — E1169 Type 2 diabetes mellitus with other specified complication: Secondary | ICD-10-CM | POA: Diagnosis not present

## 2023-01-22 DIAGNOSIS — E119 Type 2 diabetes mellitus without complications: Secondary | ICD-10-CM

## 2023-01-22 DIAGNOSIS — I10 Essential (primary) hypertension: Secondary | ICD-10-CM

## 2023-01-22 DIAGNOSIS — F17200 Nicotine dependence, unspecified, uncomplicated: Secondary | ICD-10-CM

## 2023-01-22 DIAGNOSIS — E785 Hyperlipidemia, unspecified: Secondary | ICD-10-CM | POA: Diagnosis not present

## 2023-01-22 DIAGNOSIS — Z7984 Long term (current) use of oral hypoglycemic drugs: Secondary | ICD-10-CM

## 2023-01-22 MED ORDER — VARENICLINE TARTRATE 1 MG PO TABS: 1.0000 mg | ORAL_TABLET | Freq: Two times a day (BID) | ORAL | 3 refills | Status: DC

## 2023-01-22 MED ORDER — VARENICLINE TARTRATE (STARTER) 0.5 MG X 11 & 1 MG X 42 PO TBPK: ORAL_TABLET | ORAL | 0 refills | Status: DC

## 2023-01-22 NOTE — Patient Instructions (Addendum)
 Start Chantix . Pick a quit date and start it 7 days prior. On the quit date, stop smoking.  Labs today.  Follow up in 6 months.   Please call [phone] to schedule your mammogram.  Other screenings recommended - Lung cancer screening, Yearly eye exam, Shingles vaccine (pharmacy), Pneumonia vaccine.

## 2023-01-23 LAB — LIPID PANEL
Chol/HDL Ratio: 3.1 {ratio} (ref 0.0–4.4)
Cholesterol, Total: 174 mg/dL (ref 100–199)
HDL: 56 mg/dL (ref >39)
LDL Chol Calc (NIH): 104 mg/dL — ABNORMAL HIGH (ref 0–99)
Triglycerides: 73 mg/dL (ref 0–149)
VLDL Cholesterol Cal: 14 mg/dL (ref 5–40)

## 2023-01-23 LAB — CMP14+EGFR
ALT: 26 [IU]/L (ref 0–32)
AST: 19 [IU]/L (ref 0–40)
Albumin: 4.6 g/dL (ref 3.8–4.9)
Alkaline Phosphatase: 104 [IU]/L (ref 44–121)
BUN/Creatinine Ratio: 17 (ref 9–23)
BUN: 12 mg/dL (ref 6–24)
Bilirubin Total: 0.6 mg/dL (ref 0.0–1.2)
CO2: 27 mmol/L (ref 20–29)
Calcium: 10 mg/dL (ref 8.7–10.2)
Chloride: 98 mmol/L (ref 96–106)
Creatinine, Ser: 0.72 mg/dL (ref 0.57–1.00)
Globulin, Total: 2.3 g/dL (ref 1.5–4.5)
Glucose: 99 mg/dL (ref 70–99)
Potassium: 4.3 mmol/L (ref 3.5–5.2)
Sodium: 140 mmol/L (ref 134–144)
Total Protein: 6.9 g/dL (ref 6.0–8.5)
eGFR: 97 mL/min/{1.73_m2} (ref >59)

## 2023-01-23 LAB — MICROALBUMIN / CREATININE URINE RATIO
Creatinine, Urine: 30.4 mg/dL
Microalb/Creat Ratio: <10 mg/g{creat} (ref 0–29)
Microalbumin, Urine: <3 ug/mL

## 2023-01-23 LAB — HEMOGLOBIN A1C
Est. average glucose Bld gHb Est-mCnc: 134 mg/dL
Hgb A1c MFr Bld: 6.3 % — ABNORMAL HIGH (ref 4.8–5.6)

## 2023-01-25 DIAGNOSIS — E119 Type 2 diabetes mellitus without complications: Secondary | ICD-10-CM | POA: Insufficient documentation

## 2023-01-25 NOTE — Progress Notes (Signed)
 Subjective:  Patient ID: Sarah Henderson, female    DOB: [birth year]  Age: 58 y.o. MRN: [id number]  CC: Follow-up   HPI:  58 year old female with hypertension, multinodular goiter, recently diagnosed type 2 diabetes, tobacco use disorder, hyperlipidemia presents for follow-up.  Patient has changed her diet but is still struggling with weight gain.  Patient continues to smoke.  She is interested in discussing smoking cessation today.  Hypertension stable.  Patient is overdue for mammogram.  Needs labs.  Will discuss CT lung cancer screening and other preventative health items today as well.  Patient Active Problem List   Diagnosis Date Noted   Type 2 diabetes mellitus without complication, without long-term current use of insulin (HCC) 01/25/2023   Chronic pain of left knee 12/18/2021   Essential hypertension 07/17/2021   Hyperlipidemia 07/17/2021   Tobacco use disorder 07/17/2021   Multinodular goiter 06/21/2017    Social Hx   Social History   Socioeconomic History   Marital status: Married    Spouse name: Not on file   Number of children: Not on file   Years of education: Not on file   Highest education level: 8th grade  Occupational History   Not on file  Tobacco Use   Smoking status: Every Day    Current packs/day: 1.00    Average packs/day: 1 pack/day for 40.0 years (40.0 ttl pk-yrs)    Types: Cigarettes   Smokeless tobacco: Never  Vaping Use   Vaping status: Never Used  Substance and Sexual Activity   Alcohol use: No   Drug use: No   Sexual activity: Not on file  Other Topics Concern   Not on file  Social History Narrative   Not on file   Social Drivers of Health   Financial Resource Strain: Low Risk  (01/21/2023)   Overall Financial Resource Strain (CARDIA)    Difficulty of Paying Living Expenses: Not hard at all  Food Insecurity: No Food Insecurity (01/21/2023)   Hunger Vital Sign    Worried About Running Out of Food in the Last Year: Never true    Ran  Out of Food in the Last Year: Never true  Transportation Needs: No Transportation Needs (01/21/2023)   PRAPARE - Administrator, Civil Service (Medical): No    Lack of Transportation (Non-Medical): No  Physical Activity: Sufficiently Active (01/21/2023)   Exercise Vital Sign    Days of Exercise per Week: 7 days    Minutes of Exercise per Session: 30 min  Stress: No Stress Concern Present (01/21/2023)   [employer] of Occupational Health - Occupational Stress Questionnaire    Feeling of Stress : Not at all  Social Connections: Moderately Isolated (01/21/2023)   Social Connection and Isolation Panel [NHANES]    Frequency of Communication with Friends and Family: More than three times a week    Frequency of Social Gatherings with Friends and Family: More than three times a week    Attends Religious Services: Never    Diplomatic Services Operational Officer: No    Attends Engineer, Structural: Not on file    Marital Status: Married    Review of Systems Per HPI  Objective:  BP 120/83   Pulse 90   Temp 98.2 F (36.8 C)   Ht 5' 2.6 (1.59 m)   Wt 194 lb 9.6 oz (88.3 kg)   LMP 10/26/2010   SpO2 99%   BMI 34.91 kg/m      01/22/2023  9:00 AM 10/01/2022    4:01 PM 06/17/2022    9:46 AM  BP/Weight  Systolic BP 120 122 121  Diastolic BP 83 86 79  Wt. (Lbs) 194.6 178.4 194  BMI 34.91 kg/m2 32.01 kg/m2 34.81 kg/m2    Physical Exam Vitals and nursing note reviewed.  Constitutional:      General: She is not in acute distress.    Appearance: Normal appearance.  HENT:     Head: Normocephalic and atraumatic.  Eyes:     General:        Right eye: No discharge.        Left eye: No discharge.     Conjunctiva/sclera: Conjunctivae normal.  Cardiovascular:     Rate and Rhythm: Normal rate and regular rhythm.  Pulmonary:     Effort: Pulmonary effort is normal.     Breath sounds: Normal breath sounds. No wheezing, rhonchi or rales.  Neurological:     Mental  Status: She is alert.  Psychiatric:        Mood and Affect: Mood normal.        Behavior: Behavior normal.     Lab Results  Component Value Date   WBC 7.2 06/17/2022   HGB 14.6 06/17/2022   HCT 43.1 06/17/2022   PLT 259 06/17/2022   GLUCOSE 99 01/22/2023   CHOL 174 01/22/2023   TRIG 73 01/22/2023   HDL 56 01/22/2023   LDLCALC 104 (H) 01/22/2023   ALT 26 01/22/2023   AST 19 01/22/2023   NA 140 01/22/2023   K 4.3 01/22/2023   CL 98 01/22/2023   CREATININE 0.72 01/22/2023   BUN 12 01/22/2023   CO2 27 01/22/2023   TSH 0.844 06/17/2022   HGBA1C 6.3 (H) 01/22/2023     Assessment & Plan:   Problem List Items Addressed This Visit       Cardiovascular and Mediastinum   Essential hypertension   Stable on HCTZ.  Continue.        Endocrine   Type 2 diabetes mellitus without complication, without long-term current use of insulin (HCC) - Primary   A1c improved.  Continue metformin .      Relevant Orders   CMP14+EGFR (Completed)   Hemoglobin A1c (Completed)   Microalbumin / creatinine urine ratio (Completed)     Other   Hyperlipidemia   Relevant Orders   Lipid panel (Completed)   Tobacco use disorder   After discussion of treatment options, starting on Chantix .       Meds ordered this encounter  Medications   Varenicline  Tartrate, Starter, (CHANTIX  STARTING MONTH PAK) 0.5 MG X 11 & 1 MG X 42 TBPK    Sig: Days 1 to 3: 0.5 mg once daily. Days 4 to 7: 0.5 mg twice daily. Maintenance (day 8 and later): 1 mg twice daily.    Dispense:  53 each    Refill:  0   varenicline  (CHANTIX  CONTINUING MONTH PAK) 1 MG tablet    Sig: Take 1 tablet (1 mg total) by mouth 2 (two) times daily.    Dispense:  60 tablet    Refill:  3    Follow-up:  Return in about 6 months (around 07/22/2023).  Jacqulyn Ahle DO [facility]

## 2023-01-25 NOTE — Assessment & Plan Note (Signed)
 A1c improved.  Continue metformin.

## 2023-01-25 NOTE — Assessment & Plan Note (Signed)
Stable on HCTZ.  Continue. 

## 2023-01-25 NOTE — Assessment & Plan Note (Signed)
 After discussion of treatment options, starting on Chantix.

## 2023-01-27 ENCOUNTER — Other Ambulatory Visit: Payer: Self-pay | Admitting: Family Medicine

## 2023-01-27 MED ORDER — ATORVASTATIN CALCIUM 80 MG PO TABS: 80.0000 mg | ORAL_TABLET | Freq: Every day | ORAL | 3 refills | Status: DC

## 2023-02-02 ENCOUNTER — Ambulatory Visit: Payer: Self-pay | Admitting: Family Medicine

## 2023-02-02 NOTE — Telephone Encounter (Signed)
 Copied from CRM [id number]. Topic: Clinical - Medical Advice >> Feb 02, 2023  3:26 PM Ivette P wrote: Reason for CRM: Pt calling because she was prescribed atorvastatin  (LIPITOR) 80 MG tablet and the pills are very big and hard for pt to keep down. Pt is requesting if there is another alternative for her to be able to take medication, requesting [phone]

## 2023-02-03 ENCOUNTER — Other Ambulatory Visit: Payer: Self-pay | Admitting: Family Medicine

## 2023-02-03 MED ORDER — ROSUVASTATIN CALCIUM 40 MG PO TABS: 40.0000 mg | ORAL_TABLET | Freq: Every day | ORAL | 3 refills | Status: DC

## 2023-02-04 NOTE — Telephone Encounter (Signed)
Sarah Sams, DO     Rx sent for Crestor.

## 2023-02-04 NOTE — Telephone Encounter (Signed)
Patient notified

## 2023-02-16 ENCOUNTER — Other Ambulatory Visit: Payer: Self-pay | Admitting: Family Medicine

## 2023-03-13 ENCOUNTER — Ambulatory Visit
Admission: EM | Admit: 2023-03-13 | Discharge: 2023-03-13 | Disposition: A | Discharge status: Home / Self Care | Payer: Medicaid Other | Attending: Family Medicine | Admitting: Family Medicine

## 2023-03-13 DIAGNOSIS — J101 Influenza due to other identified influenza virus with other respiratory manifestations: Secondary | ICD-10-CM | POA: Diagnosis not present

## 2023-03-13 LAB — POC COVID19/FLU A&B COMBO
Covid Antigen, POC: NEGATIVE
Influenza A Antigen, POC: POSITIVE — AB
Influenza B Antigen, POC: NEGATIVE

## 2023-03-13 MED ORDER — OSELTAMIVIR PHOSPHATE 75 MG PO CAPS: 75.0000 mg | ORAL_CAPSULE | Freq: Two times a day (BID) | ORAL | 0 refills | Status: DC

## 2023-03-13 NOTE — ED Triage Notes (Signed)
 Pt reports she has some nasal congestion and a cough x 2 days   Took robitussin and ibuprofen

## 2023-03-13 NOTE — ED Provider Notes (Signed)
 [facility]    CSN: [id number] Arrival date & time: 03/13/23  1138      History   Chief Complaint Chief Complaint  Patient presents with   Cough    HPI Sarah Henderson is a 58 y.o. female.   Presenting today with 2-day history of nasal congestion, cough, body aches, fatigue.  Denies chest pain, shortness of breath, vomiting, diarrhea.  So far trying Robitussin and ibuprofen with mild temporary benefit.  No known history of chronic pulmonary disease.    Past Medical History:  Diagnosis Date   Arthritis    Depression    Hypertension     Patient Active Problem List   Diagnosis Date Noted   Type 2 diabetes mellitus without complication, without long-term current use of insulin (HCC) 01/25/2023   Chronic pain of left knee 12/18/2021   Essential hypertension 07/17/2021   Hyperlipidemia 07/17/2021   Tobacco use disorder 07/17/2021   Multinodular goiter 06/21/2017    History reviewed. No pertinent surgical history.  OB History   No obstetric history on file.      Home Medications    Prior to Admission medications   Medication Sig Start Date End Date Taking? Authorizing Provider  oseltamivir (TAMIFLU) 75 MG capsule Take 1 capsule (75 mg total) by mouth every 12 (twelve) hours. 03/13/23  Yes Particia Nearing, PA-C  albuterol (PROVENTIL) (2.5 MG/3ML) 0.083% nebulizer solution Take 3 mLs (2.5 mg total) by nebulization every 2 (two) hours as needed for wheezing or shortness of breath. 11/19/20   Jacquelin Hawking, PA-C  albuterol (VENTOLIN HFA) 108 (90 Base) MCG/ACT inhaler Inhale 1-2 puffs into the lungs every 6 (six) hours as needed for wheezing or shortness of breath. 01/22/22 05/12/22  Tommie Sams, DO  cetirizine (ZYRTEC) 10 MG tablet Take 1 tablet by mouth once daily 02/16/23   Cook, Jayce G, DO  fluticasone (FLONASE) 50 MCG/ACT nasal spray Place 2 sprays into both nostrils daily. 10/01/22   Tommie Sams, DO  hydrochlorothiazide (HYDRODIURIL) 25 MG tablet  Take 1 tablet by mouth once daily 01/04/23   Adriana Simas, Jayce G, DO  meloxicam (MOBIC) 15 MG tablet Take 1 tablet (15 mg total) by mouth daily as needed (Knee pain). 12/17/21   Tommie Sams, DO  metFORMIN (GLUCOPHAGE) 500 MG tablet Take 1 tablet (500 mg total) by mouth 2 (two) times daily with a meal. Patient not taking: Reported on 01/22/2023 06/22/22   Tommie Sams, DO  rosuvastatin (CRESTOR) 40 MG tablet Take 1 tablet (40 mg total) by mouth daily. 02/03/23   Tommie Sams, DO  varenicline (CHANTIX CONTINUING MONTH PAK) 1 MG tablet Take 1 tablet (1 mg total) by mouth 2 (two) times daily. 01/22/23   Tommie Sams, DO  Varenicline Tartrate, Starter, (CHANTIX STARTING MONTH PAK) 0.5 MG X 11 & 1 MG X 42 TBPK Days 1 to 3: 0.5 mg once daily. Days 4 to 7: 0.5 mg twice daily. Maintenance (day 8 and later): 1 mg twice daily. 01/22/23   Tommie Sams, DO    Family History Family History  Problem Relation Age of Onset   Hypertension Mother    Hyperlipidemia Mother    Kidney disease Mother    Cancer Mother        kidney   Depression Mother    Hypertension Father    Cancer Father        pancreatic CA    Social History Social History   Tobacco Use  Smoking status: Every Day    Current packs/day: 1.00    Average packs/day: 1 pack/day for 40.0 years (40.0 ttl pk-yrs)    Types: Cigarettes   Smokeless tobacco: Never  Vaping Use   Vaping status: Never Used  Substance Use Topics   Alcohol use: No   Drug use: No     Allergies   Amoxicillin-pot clavulanate   Review of Systems Review of Systems Per HPI  Physical Exam Triage Vital Signs ED Triage Vitals  Encounter Vitals Group     BP 03/13/23 1305 125/78     Systolic BP Percentile --      Diastolic BP Percentile --      Pulse Rate 03/13/23 1305 94     Resp 03/13/23 1305 18     Temp 03/13/23 1305 97.9 F (36.6 C)     Temp Source 03/13/23 1305 Oral     SpO2 03/13/23 1305 96 %     Weight --      Height --      Head Circumference --       Peak Flow --      Pain Score 03/13/23 1306 0     Pain Loc --      Pain Education --      Exclude from Growth Chart --    No data found.  Updated Vital Signs BP 125/78 (BP Location: Right Arm)   Pulse 94   Temp 97.9 F (36.6 C) (Oral)   Resp 18   LMP 10/26/2010   SpO2 96%   Visual Acuity Right Eye Distance:   Left Eye Distance:   Bilateral Distance:    Right Eye Near:   Left Eye Near:    Bilateral Near:     Physical Exam Vitals and nursing note reviewed.  Constitutional:      Appearance: Normal appearance.  HENT:     Head: Atraumatic.     Right Ear: Tympanic membrane and external ear normal.     Left Ear: Tympanic membrane and external ear normal.     Nose: Rhinorrhea present.     Mouth/Throat:     Mouth: Mucous membranes are moist.     Pharynx: Posterior oropharyngeal erythema present.  Eyes:     Extraocular Movements: Extraocular movements intact.     Conjunctiva/sclera: Conjunctivae normal.  Cardiovascular:     Rate and Rhythm: Normal rate and regular rhythm.     Heart sounds: Normal heart sounds.  Pulmonary:     Effort: Pulmonary effort is normal.     Breath sounds: Normal breath sounds. No wheezing or rales.  Musculoskeletal:        General: Normal range of motion.     Cervical back: Normal range of motion and neck supple.  Skin:    General: Skin is warm and dry.  Neurological:     Mental Status: She is alert and oriented to person, place, and time.  Psychiatric:        Mood and Affect: Mood normal.        Thought Content: Thought content normal.      UC Treatments / Results  Labs (all labs ordered are listed, but only abnormal results are displayed) Labs Reviewed  POC COVID19/FLU A&B COMBO - Abnormal; Notable for the following components:      Result Value   Influenza A Antigen, POC Positive (*)    All other components within normal limits    EKG   Radiology No results found.  Procedures Procedures (  including critical care  time)  Medications Ordered in UC Medications - No data to display  Initial Impression / Assessment and Plan / UC Course  I have reviewed the triage vital signs and the nursing notes.  Pertinent labs & imaging results that were available during my care of the patient were reviewed by me and considered in my medical decision making (see chart for details).     Vitals and exam reassuring today, rapid flu positive for influenza A.  Treat with Tamiflu, supportive over-the-counter medications at home care.  Return for worsening symptoms.  Final Clinical Impressions(s) / UC Diagnoses   Final diagnoses:  Influenza A   Discharge Instructions   None    ED Prescriptions     Medication Sig Dispense Auth. Provider   oseltamivir (TAMIFLU) 75 MG capsule Take 1 capsule (75 mg total) by mouth every 12 (twelve) hours. 10 capsule Particia Nearing, [place]      PDMP not reviewed this encounter.   Particia Nearing, [place] 03/13/23 1423

## 2023-03-20 ENCOUNTER — Other Ambulatory Visit: Payer: Self-pay | Admitting: Family Medicine

## 2023-04-02 ENCOUNTER — Other Ambulatory Visit: Payer: Self-pay | Admitting: Family Medicine

## 2023-04-02 DIAGNOSIS — I10 Essential (primary) hypertension: Secondary | ICD-10-CM

## 2023-05-30 ENCOUNTER — Other Ambulatory Visit: Payer: Self-pay | Admitting: Family Medicine

## 2023-06-24 ENCOUNTER — Other Ambulatory Visit: Payer: Self-pay | Admitting: Family Medicine

## 2023-07-22 ENCOUNTER — Ambulatory Visit: Payer: Medicaid Other | Admitting: Family Medicine

## 2023-07-24 ENCOUNTER — Other Ambulatory Visit: Payer: Self-pay | Admitting: Family Medicine

## 2023-08-02 ENCOUNTER — Ambulatory Visit: Admitting: Family Medicine

## 2023-08-02 ENCOUNTER — Encounter: Payer: Self-pay | Admitting: Family Medicine

## 2023-08-02 VITALS — BP 132/86 | HR 120 | Temp 98.6°F | Ht 62.6 in | Wt 199.0 lb

## 2023-08-02 DIAGNOSIS — E119 Type 2 diabetes mellitus without complications: Secondary | ICD-10-CM

## 2023-08-02 DIAGNOSIS — E785 Hyperlipidemia, unspecified: Secondary | ICD-10-CM

## 2023-08-02 DIAGNOSIS — R202 Paresthesia of skin: Secondary | ICD-10-CM

## 2023-08-02 DIAGNOSIS — G5601 Carpal tunnel syndrome, right upper limb: Secondary | ICD-10-CM | POA: Diagnosis not present

## 2023-08-02 DIAGNOSIS — F17200 Nicotine dependence, unspecified, uncomplicated: Secondary | ICD-10-CM

## 2023-08-02 DIAGNOSIS — R454 Irritability and anger: Secondary | ICD-10-CM

## 2023-08-02 MED ORDER — FLUOXETINE HCL 10 MG PO CAPS: 10.0000 mg | ORAL_CAPSULE | Freq: Every day | ORAL | 3 refills | Status: AC

## 2023-08-02 MED ORDER — CETIRIZINE HCL 10 MG PO TABS: 10.0000 mg | ORAL_TABLET | Freq: Every day | ORAL | 1 refills | Status: DC

## 2023-08-02 NOTE — Patient Instructions (Signed)
 Labs placed.  Referral placed.  Xray at the hospital.  Follow up in 3 months.

## 2023-08-03 DIAGNOSIS — R454 Irritability and anger: Secondary | ICD-10-CM | POA: Insufficient documentation

## 2023-08-03 DIAGNOSIS — R202 Paresthesia of skin: Secondary | ICD-10-CM | POA: Insufficient documentation

## 2023-08-03 DIAGNOSIS — G5601 Carpal tunnel syndrome, right upper limb: Secondary | ICD-10-CM | POA: Insufficient documentation

## 2023-08-03 NOTE — Assessment & Plan Note (Signed)
 Discussed cessation of Chantix  and potential start of Wellbutrin.  Patient elects to continue Chantix .

## 2023-08-03 NOTE — Assessment & Plan Note (Signed)
 Patient endorses stress at home as well as stress with her job.  Trial of fluoxetine .

## 2023-08-03 NOTE — Progress Notes (Signed)
 Subjective:  Patient ID: Sarah Henderson, female    DOB: 12/27/1965  Age: 58 y.o. MRN: [id number]  CC:   Chief Complaint  Patient presents with   discuss smoking cessation meds    Nightmares, waking up and eating in the middle of the night   right hand carpal tunnel     numbness   right hip to knee numbness    When sitting long idstances   mood swings     Irritable and sadness - request prozac  refill    HPI:  58 year old female with hypertension, multinodular goiter, type 2 diabetes, hyperlipidemia, tobacco abuse presents for follow-up.  She has multiple issues/concerns today.    Patient reports that over the past 2 years she has had ongoing right hand numbness.  Has a history of carpal tunnel.  Had an injection many years ago which significantly improved her symptoms.  She states that she has had worsening and more persistent numbness/paresthesias for the past week.  Patient is interested and injection for carpal tunnel.  Patient reports that for the past 1-1/2 months she has had issues with her mood.  She is very irritable and snaps easily.  She states that she has had this previously and she responded well to fluoxetine .  She would like to discuss this today.  Patient also reports that over the past 3 weeks she has had numbness of the right anterior thigh particularly worse with sitting and driving.  No significant back pain at this time.  No recent fall, trauma, injury.  It does improve after she gets up and moves around.  Patient continues to smoke but has cut back significantly.  She is having side effects from Chantix .  She states that Chantix  gives her bad dreams and then she wakes up at night and then subsequently eats.  Patient concerned about weight gain.  Patient Active Problem List   Diagnosis Date Noted   Carpal tunnel syndrome of right wrist 08/03/2023   Paresthesia of right leg 08/03/2023   Irritability 08/03/2023   Type 2 diabetes mellitus without complication,  without long-term current use of insulin (HCC) 01/25/2023   Chronic pain of left knee 12/18/2021   Essential hypertension 07/17/2021   Hyperlipidemia 07/17/2021   Tobacco use disorder 07/17/2021   Multinodular goiter 06/21/2017    Social Hx   Social History   Socioeconomic History   Marital status: Married    Spouse name: Not on file   Number of children: Not on file   Years of education: Not on file   Highest education level: 8th grade  Occupational History   Not on file  Tobacco Use   Smoking status: Every Day    Current packs/day: 1.00    Average packs/day: 1 pack/day for 40.0 years (40.0 ttl pk-yrs)    Types: Cigarettes   Smokeless tobacco: Never  Vaping Use   Vaping status: Never Used  Substance and Sexual Activity   Alcohol use: No   Drug use: No   Sexual activity: Not on file  Other Topics Concern   Not on file  Social History Narrative   Not on file   Social Drivers of Health   Financial Resource Strain: Low Risk  (08/01/2023)   Overall Financial Resource Strain (CARDIA)    Difficulty of Paying Living Expenses: Not hard at all  Food Insecurity: No Food Insecurity (08/01/2023)   Hunger Vital Sign    Worried About Running Out of Food in the Last Year: Never true  Ran Out of Food in the Last Year: Never true  Transportation Needs: No Transportation Needs (08/01/2023)   PRAPARE - Administrator, Civil Service (Medical): No    Lack of Transportation (Non-Medical): No  Physical Activity: Unknown (08/01/2023)   Exercise Vital Sign    Days of Exercise per Week: 5 days    Minutes of Exercise per Session: Not on file  Stress: No Stress Concern Present (08/01/2023)   [employer] of Occupational Health - Occupational Stress Questionnaire    Feeling of Stress: Not at all  Social Connections: Moderately Isolated (08/01/2023)   Social Connection and Isolation Panel    Frequency of Communication with Friends and Family: More than three times a week     Frequency of Social Gatherings with Friends and Family: Never    Attends Religious Services: Never    Diplomatic Services operational officer: No    Attends Engineer, structural: Not on file    Marital Status: Married    Review of Systems Per HPI  Objective:  BP 132/86   Pulse (!) 120   Temp 98.6 F (37 C)   Ht 5' 2.6 (1.59 m)   Wt 199 lb (90.3 kg)   LMP 10/26/2010   SpO2 97%   BMI 35.70 kg/m      08/02/2023    3:08 PM 08/02/2023    2:32 PM 03/13/2023    1:05 PM  BP/Weight  Systolic BP 132 131 125  Diastolic BP 86 92 78  Wt. (Lbs)  199   BMI  35.7 kg/m2     Physical Exam Vitals and nursing note reviewed.  Constitutional:      Appearance: Normal appearance. She is obese.  HENT:     Head: Normocephalic and atraumatic.  Cardiovascular:     Rate and Rhythm: Regular rhythm. Tachycardia present.  Pulmonary:     Effort: Pulmonary effort is normal.     Breath sounds: Normal breath sounds. No wheezing or rales.  Neurological:     Mental Status: She is alert.  Psychiatric:        Mood and Affect: Mood normal.        Behavior: Behavior normal.     Lab Results  Component Value Date   WBC 7.2 06/17/2022   HGB 14.6 06/17/2022   HCT 43.1 06/17/2022   PLT 259 06/17/2022   GLUCOSE 99 01/22/2023   CHOL 174 01/22/2023   TRIG 73 01/22/2023   HDL 56 01/22/2023   LDLCALC 104 (H) 01/22/2023   ALT 26 01/22/2023   AST 19 01/22/2023   NA 140 01/22/2023   K 4.3 01/22/2023   CL 98 01/22/2023   CREATININE 0.72 01/22/2023   BUN 12 01/22/2023   CO2 27 01/22/2023   TSH 0.844 06/17/2022   HGBA1C 6.3 (H) 01/22/2023     Assessment & Plan:  Carpal tunnel syndrome of right wrist Assessment & Plan: Referral to sports medicine for injection.  Orders: -     Ambulatory referral to Sports Medicine  Hyperlipidemia, unspecified hyperlipidemia type -     Lipid panel  Type 2 diabetes mellitus without complication, without long-term current use of insulin (HCC) -      Hemoglobin A1c  Paresthesia of right leg Assessment & Plan: Meralgia paresthetica versus lumbar radiculopathy.  X-ray for further evaluation.  Orders: -     DG Lumbar Spine Complete  Irritability Assessment & Plan: Patient endorses stress at home as well as stress with her job.  Trial of fluoxetine .   Tobacco use disorder Assessment & Plan: Discussed cessation of Chantix  and potential start of Wellbutrin.  Patient elects to continue Chantix .   Other orders -     FLUoxetine  HCl; Take 1 capsule (10 mg total) by mouth daily.  Dispense: 90 capsule; Refill: 3 -     Cetirizine  HCl; Take 1 tablet (10 mg total) by mouth daily.  Dispense: 90 tablet; Refill: 1    Follow-up:  3 months  Makoto Sellitto Bluford DO [facility]

## 2023-08-03 NOTE — Assessment & Plan Note (Signed)
Referral to sports medicine for injection.

## 2023-08-03 NOTE — Assessment & Plan Note (Signed)
 Meralgia paresthetica versus lumbar radiculopathy.  X-ray for further evaluation.

## 2023-08-11 NOTE — Progress Notes (Unsigned)
    Ben Jackson D.[facility] [address] [place] [address] Phone: [phone]   Assessment and Plan:     There are no diagnoses linked to this encounter.  ***   Pertinent previous records reviewed include ***    Follow Up: ***     Subjective:   I, Lorine Iannaccone, am serving as a Neurosurgeon for Doctor Morene Mace  Chief Complaint: right wrist pain   HPI:   08/12/2023 Patient is a 58 year old female with right wrist pain. Patient states   Relevant Historical Information: ***  Additional pertinent review of systems negative.   Current Outpatient Medications:    albuterol  (PROVENTIL ) (2.5 MG/3ML) 0.083% nebulizer solution, Take 3 mLs (2.5 mg total) by nebulization every 2 (two) hours as needed for wheezing or shortness of breath., Disp: 150 mL, Rfl: 1   albuterol  (VENTOLIN  HFA) 108 (90 Base) MCG/ACT inhaler, Inhale 1-2 puffs into the lungs every 6 (six) hours as needed for wheezing or shortness of breath., Disp: 1 each, Rfl: 0   cetirizine  (ZYRTEC ) 10 MG tablet, Take 1 tablet (10 mg total) by mouth daily., Disp: 90 tablet, Rfl: 1   FLUoxetine  (PROZAC ) 10 MG capsule, Take 1 capsule (10 mg total) by mouth daily., Disp: 90 capsule, Rfl: 3   fluticasone  (FLONASE ) 50 MCG/ACT nasal spray, Place 2 sprays into both nostrils daily., Disp: 16 g, Rfl: 6   hydrochlorothiazide  (HYDRODIURIL ) 25 MG tablet, Take 1 tablet by mouth once daily, Disp: 90 tablet, Rfl: 3   meloxicam  (MOBIC ) 15 MG tablet, Take 1 tablet (15 mg total) by mouth daily as needed (Knee pain)., Disp: 30 tablet, Rfl: 5   rosuvastatin  (CRESTOR ) 40 MG tablet, Take 1 tablet (40 mg total) by mouth daily., Disp: 90 tablet, Rfl: 3   varenicline  (CHANTIX  CONTINUING MONTH PAK) 1 MG tablet, Take 1 tablet (1 mg total) by mouth 2 (two) times daily., Disp: 60 tablet, Rfl: 3   Objective:     There were no vitals filed for this visit.    There is no height or weight on file to calculate  BMI.    Physical Exam:    ***   Electronically signed by:  Odis Mace D.[facility] 7:34 AM 08/11/23

## 2023-08-12 ENCOUNTER — Ambulatory Visit: Admitting: Sports Medicine

## 2023-08-12 ENCOUNTER — Ambulatory Visit: Payer: Self-pay

## 2023-08-12 VITALS — HR 101 | Ht 62.0 in | Wt 198.0 lb

## 2023-08-12 DIAGNOSIS — M25531 Pain in right wrist: Secondary | ICD-10-CM | POA: Diagnosis not present

## 2023-08-12 DIAGNOSIS — G5601 Carpal tunnel syndrome, right upper limb: Secondary | ICD-10-CM | POA: Diagnosis not present

## 2023-08-12 NOTE — Patient Instructions (Signed)
 Wrist HEP  As needed follow up

## 2023-10-05 ENCOUNTER — Ambulatory Visit (HOSPITAL_COMMUNITY)
Admission: RE | Admit: 2023-10-05 | Discharge: 2023-10-05 | Disposition: A | Discharge status: Home / Self Care | Source: Ambulatory Visit | Attending: Family Medicine | Admitting: Family Medicine

## 2023-10-05 ENCOUNTER — Ambulatory Visit: Payer: Self-pay | Admitting: Family Medicine

## 2023-10-05 DIAGNOSIS — R202 Paresthesia of skin: Secondary | ICD-10-CM | POA: Insufficient documentation

## 2023-10-06 ENCOUNTER — Other Ambulatory Visit: Payer: Self-pay | Admitting: Family Medicine

## 2023-10-06 NOTE — Telephone Encounter (Signed)
 Patient is on her feet all day and would like recommendations on what to take for her L4 L5 back pain, requesting something for the pain. Please advise

## 2023-10-09 ENCOUNTER — Ambulatory Visit
Admission: EM | Admit: 2023-10-09 | Discharge: 2023-10-09 | Disposition: A | Discharge status: Home / Self Care | Attending: Family Medicine | Admitting: Family Medicine

## 2023-10-09 ENCOUNTER — Encounter: Payer: Self-pay | Admitting: Emergency Medicine

## 2023-10-09 ENCOUNTER — Other Ambulatory Visit: Payer: Self-pay

## 2023-10-09 DIAGNOSIS — J069 Acute upper respiratory infection, unspecified: Secondary | ICD-10-CM

## 2023-10-09 DIAGNOSIS — J441 Chronic obstructive pulmonary disease with (acute) exacerbation: Secondary | ICD-10-CM | POA: Diagnosis not present

## 2023-10-09 HISTORY — DX: Other intervertebral disc degeneration, lumbar region without mention of lumbar back pain or lower extremity pain: M51.369

## 2023-10-09 LAB — POC COVID19/FLU A&B COMBO
Covid Antigen, POC: NEGATIVE
Influenza A Antigen, POC: NEGATIVE
Influenza B Antigen, POC: NEGATIVE

## 2023-10-09 MED ORDER — ALBUTEROL SULFATE HFA 108 (90 BASE) MCG/ACT IN AERS: 2.0000 | INHALATION_SPRAY | RESPIRATORY_TRACT | 0 refills | Status: AC | PRN

## 2023-10-09 MED ORDER — AZELASTINE HCL 0.1 % NA SOLN: 1.0000 | Freq: Two times a day (BID) | NASAL | 0 refills | Status: AC

## 2023-10-09 MED ORDER — DEXAMETHASONE SODIUM PHOSPHATE 10 MG/ML IJ SOLN
10.0000 mg | Freq: Once | INTRAMUSCULAR | Status: AC
  Administered 2023-10-09: 10 mg via INTRAMUSCULAR

## 2023-10-09 MED ORDER — PROMETHAZINE-DM 6.25-15 MG/5ML PO SYRP: 5.0000 mL | ORAL_SOLUTION | Freq: Four times a day (QID) | ORAL | 0 refills | Status: AC | PRN

## 2023-10-09 NOTE — ED Triage Notes (Signed)
 Pt reports cough, sore throat since yesterday. Pt spouse has something similar. Denies any known fevers. Has taken otc HBP Nyquil last night and reports minimal improvement of symptoms.

## 2023-10-09 NOTE — ED Provider Notes (Signed)
 [facility]    CSN: [id number] Arrival date & time: 10/09/23  1151      History   Chief Complaint Chief Complaint  Patient presents with   Cough    HPI Sarah Henderson is a 58 y.o. female.   Patient presenting today with 1 day history of sore throat, cough, congestion, chest tightness.  Denies fever, chills, body aches, chest pain, shortness of breath, abdominal pain, vomiting, diarrhea.  So far trying over-the-counter NyQuil with minimal relief.  Chronic cigarette smoker, states she has been told she has early stages of COPD in the past.  Frequently gets bronchitis as well.  Does not currently have an inhaler at home.    Past Medical History:  Diagnosis Date   Arthritis    Degenerative disc disease, lumbar    Depression    Hypertension     Patient Active Problem List   Diagnosis Date Noted   Carpal tunnel syndrome of right wrist 08/03/2023   Paresthesia of right leg 08/03/2023   Irritability 08/03/2023   Type 2 diabetes mellitus without complication, without long-term current use of insulin (HCC) 01/25/2023   Chronic pain of left knee 12/18/2021   Essential hypertension 07/17/2021   Hyperlipidemia 07/17/2021   Tobacco use disorder 07/17/2021   Multinodular goiter 06/21/2017    History reviewed. No pertinent surgical history.  OB History   No obstetric history on file.      Home Medications    Prior to Admission medications   Medication Sig Start Date End Date Taking? Authorizing Provider  azelastine  (ASTELIN ) 0.1 % nasal spray Place 1 spray into both nostrils 2 (two) times daily. Use in each nostril as directed 10/09/23  Yes Stuart Vernell Norris, PA-C  promethazine -dextromethorphan (PROMETHAZINE -DM) 6.25-15 MG/5ML syrup Take 5 mLs by mouth 4 (four) times daily as needed. 10/09/23  Yes Stuart Vernell Norris, PA-C  albuterol  (PROVENTIL ) (2.5 MG/3ML) 0.083% nebulizer solution Take 3 mLs (2.5 mg total) by nebulization every 2 (two) hours as needed for  wheezing or shortness of breath. 11/19/20   Comer Kirsch, PA-C  albuterol  (VENTOLIN  HFA) 108 (90 Base) MCG/ACT inhaler Inhale 2 puffs into the lungs every 4 (four) hours as needed for wheezing or shortness of breath. 10/09/23   Stuart Vernell Norris, PA-C  cetirizine  (ZYRTEC ) 10 MG tablet Take 1 tablet (10 mg total) by mouth daily. 08/02/23   Cook, Jayce G, DO  FLUoxetine  (PROZAC ) 10 MG capsule Take 1 capsule (10 mg total) by mouth daily. 08/02/23   Cook, Jayce G, DO  fluticasone  (FLONASE ) 50 MCG/ACT nasal spray Place 2 sprays into both nostrils daily. 10/01/22   Cook, Jayce G, DO  hydrochlorothiazide  (HYDRODIURIL ) 25 MG tablet Take 1 tablet by mouth once daily 04/02/23   Cook, Jayce G, DO  meloxicam  (MOBIC ) 15 MG tablet Take 1 tablet (15 mg total) by mouth daily as needed (Knee pain). 12/17/21   Cook, Jayce G, DO  rosuvastatin  (CRESTOR ) 40 MG tablet Take 1 tablet (40 mg total) by mouth daily. 02/03/23   Cook, Jayce G, DO  varenicline  (CHANTIX  CONTINUING MONTH PAK) 1 MG tablet Take 1 tablet (1 mg total) by mouth 2 (two) times daily. 01/22/23   Cook, Jayce G, DO    Family History Family History  Problem Relation Age of Onset   Hypertension Mother    Hyperlipidemia Mother    Kidney disease Mother    Cancer Mother        kidney   Depression Mother    Hypertension  Father    Cancer Father        pancreatic CA    Social History Social History   Tobacco Use   Smoking status: Every Day    Current packs/day: 0.50    Average packs/day: 0.5 packs/day for 40.0 years (20.0 ttl pk-yrs)    Types: Cigarettes   Smokeless tobacco: Never  Vaping Use   Vaping status: Never Used  Substance Use Topics   Alcohol use: No   Drug use: No     Allergies   Amoxicillin-pot clavulanate   Review of Systems Review of Systems Per HPI  Physical Exam Triage Vital Signs ED Triage Vitals  Encounter Vitals Group     BP 10/09/23 1210 124/82     Girls Systolic BP Percentile --      Girls Diastolic BP  Percentile --      Boys Systolic BP Percentile --      Boys Diastolic BP Percentile --      Pulse Rate 10/09/23 1210 (!) 108     Resp 10/09/23 1210 20     Temp 10/09/23 1210 97.6 F (36.4 C)     Temp Source 10/09/23 1210 Oral     SpO2 10/09/23 1210 96 %     Weight --      Height --      Head Circumference --      Peak Flow --      Pain Score 10/09/23 1213 8     Pain Loc --      Pain Education --      Exclude from Growth Chart --    No data found.  Updated Vital Signs BP 124/82 (BP Location: Right Arm)   Pulse (!) 108   Temp 97.6 F (36.4 C) (Oral)   Resp 20   LMP 10/26/2010   SpO2 96%   Visual Acuity Right Eye Distance:   Left Eye Distance:   Bilateral Distance:    Right Eye Near:   Left Eye Near:    Bilateral Near:     Physical Exam Vitals and nursing note reviewed.  Constitutional:      Appearance: Normal appearance.  HENT:     Head: Atraumatic.     Right Ear: Tympanic membrane and external ear normal.     Left Ear: Tympanic membrane and external ear normal.     Nose: Rhinorrhea present.     Mouth/Throat:     Mouth: Mucous membranes are moist.     Pharynx: Posterior oropharyngeal erythema present.  Eyes:     Extraocular Movements: Extraocular movements intact.     Conjunctiva/sclera: Conjunctivae normal.  Cardiovascular:     Rate and Rhythm: Regular rhythm. Tachycardia present.     Heart sounds: Normal heart sounds.  Pulmonary:     Effort: Pulmonary effort is normal.     Breath sounds: Wheezing present. No rales.  Musculoskeletal:        General: Normal range of motion.     Cervical back: Normal range of motion and neck supple.  Skin:    General: Skin is warm and dry.  Neurological:     Mental Status: She is alert and oriented to person, place, and time.  Psychiatric:        Mood and Affect: Mood normal.        Thought Content: Thought content normal.      UC Treatments / Results  Labs (all labs ordered are listed, but only abnormal results  are displayed) Labs Reviewed  POC COVID19/FLU A&B COMBO    EKG   Radiology No results found.  Procedures Procedures (including critical care time)  Medications Ordered in UC Medications  dexamethasone  (DECADRON ) injection 10 mg (10 mg Intramuscular Given 10/09/23 1332)    Initial Impression / Assessment and Plan / UC Course  I have reviewed the triage vital signs and the nursing notes.  Pertinent labs & imaging results that were available during my care of the patient were reviewed by me and considered in my medical decision making (see chart for details).     Overall vital signs reassuring today, suspect viral respiratory infection causing a COPD exacerbation.  Rapid flu and COVID-negative.  Will treat exacerbation with IM Decadron , albuterol , Phenergan  DM, Astelin , supportive over-the-counter medications and home care.  Return for worsening symptoms.  Final Clinical Impressions(s) / UC Diagnoses   Final diagnoses:  Viral URI with cough  COPD exacerbation [facility])   Discharge Instructions   None    ED Prescriptions     Medication Sig Dispense Auth. Provider   albuterol  (VENTOLIN  HFA) 108 (90 Base) MCG/ACT inhaler Inhale 2 puffs into the lungs every 4 (four) hours as needed for wheezing or shortness of breath. 18 g Stuart Vernell Norris, PA-C   promethazine -dextromethorphan (PROMETHAZINE -DM) 6.25-15 MG/5ML syrup Take 5 mLs by mouth 4 (four) times daily as needed. 100 mL Stuart Vernell Norris, PA-C   azelastine  (ASTELIN ) 0.1 % nasal spray Place 1 spray into both nostrils 2 (two) times daily. Use in each nostril as directed 30 mL Stuart Vernell Norris, PA-C      PDMP not reviewed this encounter.   Stuart Vernell Norris, PA-C 10/09/23 1439

## 2023-10-12 ENCOUNTER — Other Ambulatory Visit: Payer: Self-pay | Admitting: Family Medicine

## 2023-10-12 MED ORDER — DICLOFENAC SODIUM 75 MG PO TBEC: 75.0000 mg | DELAYED_RELEASE_TABLET | Freq: Two times a day (BID) | ORAL | 3 refills | Status: DC | PRN

## 2023-10-13 ENCOUNTER — Telehealth: Payer: Self-pay | Admitting: Pharmacy Technician

## 2023-10-13 ENCOUNTER — Other Ambulatory Visit (HOSPITAL_COMMUNITY): Payer: Self-pay

## 2023-10-13 ENCOUNTER — Telehealth: Payer: Self-pay | Admitting: Family Medicine

## 2023-10-13 ENCOUNTER — Other Ambulatory Visit: Payer: Self-pay | Admitting: Family Medicine

## 2023-10-13 MED ORDER — CELECOXIB 200 MG PO CAPS: 200.0000 mg | ORAL_CAPSULE | Freq: Every day | ORAL | 3 refills | Status: AC | PRN

## 2023-10-13 NOTE — Telephone Encounter (Signed)
 Refill on    diclofenac  (VOLTAREN ) 75 MG EC tablet  [facility]

## 2023-10-13 NOTE — Telephone Encounter (Signed)
 Pharmacy Patient Advocate Encounter   Received notification from CoverMyMeds that prior authorization for Diclofenac  Sodium 75MG  dr tablets is required/requested.   Insurance verification completed.   The patient is insured through [facility] MEDICAID .   Per test claim:  see below is preferred by the insurance.  If suggested medication is appropriate, Please send in a new RX and discontinue this one. If not, please advise as to why it's not appropriate so that we may request a Prior Authorization. Please note, some preferred medications may still require a PA.  If the suggested medications have not been trialed and there are no contraindications to their use, the PA will not be submitted, as it will not be approved.  Patient must have a trail/failure or intolerance to 2 preferred drugs.

## 2023-10-14 NOTE — Telephone Encounter (Signed)
 Cook, Jayce G, DO     10/13/23 11:31 PM Rx sent in for Celebrex  (in Lieu of Mobic ).

## 2023-10-14 NOTE — Telephone Encounter (Signed)
Duplicate- see other message

## 2023-10-27 LAB — LIPID PANEL
Chol/HDL Ratio: 2.6 ratio (ref 0.0–4.4)
Cholesterol, Total: 140 mg/dL (ref 100–199)
HDL: 53 mg/dL (ref >39)
LDL Chol Calc (NIH): 72 mg/dL (ref 0–99)
Triglycerides: 75 mg/dL (ref 0–149)
VLDL Cholesterol Cal: 15 mg/dL (ref 5–40)

## 2023-10-27 LAB — HEMOGLOBIN A1C
Est. average glucose Bld gHb Est-mCnc: 134 mg/dL
Hgb A1c MFr Bld: 6.3 % — ABNORMAL HIGH (ref 4.8–5.6)

## 2023-11-02 ENCOUNTER — Ambulatory Visit: Admitting: Family Medicine

## 2023-11-02 VITALS — BP 115/79 | Ht 62.0 in | Wt 202.2 lb

## 2023-11-02 DIAGNOSIS — I10 Essential (primary) hypertension: Secondary | ICD-10-CM

## 2023-11-02 DIAGNOSIS — E785 Hyperlipidemia, unspecified: Secondary | ICD-10-CM

## 2023-11-02 DIAGNOSIS — F17200 Nicotine dependence, unspecified, uncomplicated: Secondary | ICD-10-CM | POA: Diagnosis not present

## 2023-11-02 DIAGNOSIS — E119 Type 2 diabetes mellitus without complications: Secondary | ICD-10-CM

## 2023-11-02 DIAGNOSIS — R454 Irritability and anger: Secondary | ICD-10-CM

## 2023-11-02 NOTE — Patient Instructions (Signed)
Continue your meds.  Follow up in 6 months.  Take care  Dr. Shianne Zeiser  

## 2023-11-03 NOTE — Assessment & Plan Note (Signed)
 Recommended smoking cessation.

## 2023-11-03 NOTE — Assessment & Plan Note (Signed)
 Lipids significantly improved.  Continue Crestor .

## 2023-11-03 NOTE — Assessment & Plan Note (Signed)
 Improved on Fluoxetine . Continue.

## 2023-11-03 NOTE — Assessment & Plan Note (Signed)
 A1c at goal.

## 2023-11-03 NOTE — Progress Notes (Signed)
 Subjective:  Patient ID: Sarah Henderson, female    DOB: 1965/07/27  Age: 58 y.o. MRN: [id number]  CC:   Chief Complaint  Patient presents with   Follow-up    HPI:  58 year old female presents for follow-up.  Patient states that she is doing really well at this time.  She states that her mood is much improved and overall she is doing well.  Blood pressure is stable.  Lipids fairly well-controlled with an LDL of 72.  Tolerating statin.  Patient no longer taking Chantix .  She continues to smoke.  Recommended cessation.  A1c at goal.  Patient Active Problem List   Diagnosis Date Noted   Carpal tunnel syndrome of right wrist 08/03/2023   Paresthesia of right leg 08/03/2023   Irritability 08/03/2023   Type 2 diabetes mellitus without complication, without long-term current use of insulin (HCC) 01/25/2023   Chronic pain of left knee 12/18/2021   Essential hypertension 07/17/2021   Hyperlipidemia 07/17/2021   Tobacco use disorder 07/17/2021   Multinodular goiter 06/21/2017    Social Hx   Social History   Socioeconomic History   Marital status: Married    Spouse name: Not on file   Number of children: Not on file   Years of education: Not on file   Highest education level: 8th grade  Occupational History   Not on file  Tobacco Use   Smoking status: Every Day    Current packs/day: 0.50    Average packs/day: 0.5 packs/day for 40.0 years (20.0 ttl pk-yrs)    Types: Cigarettes   Smokeless tobacco: Never  Vaping Use   Vaping status: Never Used  Substance and Sexual Activity   Alcohol use: No   Drug use: No   Sexual activity: Not on file  Other Topics Concern   Not on file  Social History Narrative   Not on file   Social Drivers of Health   Financial Resource Strain: Low Risk  (11/02/2023)   Overall Financial Resource Strain (CARDIA)    Difficulty of Paying Living Expenses: Not very hard  Food Insecurity: Food Insecurity Present (11/02/2023)   Hunger Vital Sign     Worried About Running Out of Food in the Last Year: Sometimes true    Ran Out of Food in the Last Year: Sometimes true  Transportation Needs: No Transportation Needs (11/02/2023)   PRAPARE - Administrator, Civil Service (Medical): No    Lack of Transportation (Non-Medical): No  Physical Activity: Sufficiently Active (11/02/2023)   Exercise Vital Sign    Days of Exercise per Week: 7 days    Minutes of Exercise per Session: 150+ min  Stress: No Stress Concern Present (11/02/2023)   [employer] of Occupational Health - Occupational Stress Questionnaire    Feeling of Stress: Only a little  Social Connections: Moderately Isolated (11/02/2023)   Social Connection and Isolation Panel    Frequency of Communication with Friends and Family: More than three times a week    Frequency of Social Gatherings with Friends and Family: More than three times a week    Attends Religious Services: Never    Database administrator or Organizations: No    Attends Engineer, structural: Not on file    Marital Status: Married    Review of Systems  Respiratory: Negative.    Cardiovascular: Negative.     Objective:  BP 115/79   Ht 5' 2 (1.575 m)   Wt 202 lb 3.2 oz (91.7  kg)   LMP 10/26/2010   BMI 36.98 kg/m      11/02/2023    2:14 PM 10/09/2023   12:10 PM 08/12/2023    8:53 AM  BP/Weight  Systolic BP 115 124   Diastolic BP 79 82   Wt. (Lbs) 202.2  198  BMI 36.98 kg/m2  36.21 kg/m2    Physical Exam Vitals and nursing note reviewed.  Constitutional:      General: She is not in acute distress.    Appearance: Normal appearance.  HENT:     Head: Normocephalic and atraumatic.  Eyes:     General:        Right eye: No discharge.        Left eye: No discharge.     Conjunctiva/sclera: Conjunctivae normal.  Cardiovascular:     Rate and Rhythm: Normal rate and regular rhythm.  Pulmonary:     Effort: Pulmonary effort is normal.     Breath sounds: Normal breath sounds.  No wheezing, rhonchi or rales.  Neurological:     Mental Status: She is alert.  Psychiatric:        Mood and Affect: Mood normal.        Behavior: Behavior normal.     Lab Results  Component Value Date   WBC 7.2 06/17/2022   HGB 14.6 06/17/2022   HCT 43.1 06/17/2022   PLT 259 06/17/2022   GLUCOSE 99 01/22/2023   CHOL 140 10/26/2023   TRIG 75 10/26/2023   HDL 53 10/26/2023   LDLCALC 72 10/26/2023   ALT 26 01/22/2023   AST 19 01/22/2023   NA 140 01/22/2023   K 4.3 01/22/2023   CL 98 01/22/2023   CREATININE 0.72 01/22/2023   BUN 12 01/22/2023   CO2 27 01/22/2023   TSH 0.844 06/17/2022   HGBA1C 6.3 (H) 10/26/2023     Assessment & Plan:  Essential hypertension Assessment & Plan: Stable.  Continue HCTZ.   Type 2 diabetes mellitus without complication, without long-term current use of insulin (HCC) Assessment & Plan: A1c at goal.   Hyperlipidemia, unspecified hyperlipidemia type Assessment & Plan: Lipids significantly improved.  Continue Crestor .   Tobacco use disorder Assessment & Plan: Recommended smoking cessation.   Irritability Assessment & Plan: Improved on Fluoxetine . Continue.     Follow-up:  6 months  Maddon Horton Bluford DO [facility]

## 2023-11-03 NOTE — Assessment & Plan Note (Signed)
Stable. Continue HCTZ. 

## 2024-02-01 ENCOUNTER — Other Ambulatory Visit: Payer: Self-pay | Admitting: Family Medicine

## 2024-02-12 ENCOUNTER — Other Ambulatory Visit: Payer: Self-pay | Admitting: Family Medicine
